# Patient Record
Sex: Female | Born: 1975
Health system: Southern US, Community
[De-identification: ages and names within clinical notes are randomized; demographics above are authoritative.]

## PROBLEM LIST (undated history)

## (undated) DIAGNOSIS — I10 Essential (primary) hypertension: Secondary | ICD-10-CM

## (undated) DIAGNOSIS — E559 Vitamin D deficiency, unspecified: Secondary | ICD-10-CM

## (undated) DIAGNOSIS — K589 Irritable bowel syndrome without diarrhea: Secondary | ICD-10-CM

## (undated) DIAGNOSIS — G4733 Obstructive sleep apnea (adult) (pediatric): Secondary | ICD-10-CM

## (undated) HISTORY — DX: Essential (primary) hypertension: I10

## (undated) HISTORY — DX: Vitamin D deficiency, unspecified: E55.9

## (undated) HISTORY — DX: Irritable bowel syndrome, unspecified: K58.9

## (undated) HISTORY — PX: TUBAL LIGATION: SHX77

## (undated) HISTORY — DX: Obstructive sleep apnea (adult) (pediatric): G47.33

---

## 2013-01-06 DIAGNOSIS — L709 Acne, unspecified: Secondary | ICD-10-CM | POA: Insufficient documentation

## 2017-01-08 DIAGNOSIS — R399 Unspecified symptoms and signs involving the genitourinary system: Secondary | ICD-10-CM | POA: Insufficient documentation

## 2018-03-30 DIAGNOSIS — I10 Essential (primary) hypertension: Secondary | ICD-10-CM | POA: Insufficient documentation

## 2018-03-30 DIAGNOSIS — F32A Depression, unspecified: Secondary | ICD-10-CM | POA: Insufficient documentation

## 2018-03-30 DIAGNOSIS — F419 Anxiety disorder, unspecified: Secondary | ICD-10-CM | POA: Insufficient documentation

## 2018-03-30 DIAGNOSIS — K589 Irritable bowel syndrome without diarrhea: Secondary | ICD-10-CM | POA: Insufficient documentation

## 2018-03-30 DIAGNOSIS — K581 Irritable bowel syndrome with constipation: Secondary | ICD-10-CM | POA: Insufficient documentation

## 2018-03-30 DIAGNOSIS — G4733 Obstructive sleep apnea (adult) (pediatric): Secondary | ICD-10-CM | POA: Insufficient documentation

## 2018-04-01 DIAGNOSIS — E669 Obesity, unspecified: Secondary | ICD-10-CM | POA: Insufficient documentation

## 2018-04-01 DIAGNOSIS — R600 Localized edema: Secondary | ICD-10-CM | POA: Insufficient documentation

## 2018-04-01 DIAGNOSIS — E559 Vitamin D deficiency, unspecified: Secondary | ICD-10-CM | POA: Insufficient documentation

## 2018-06-29 DIAGNOSIS — K219 Gastro-esophageal reflux disease without esophagitis: Secondary | ICD-10-CM | POA: Insufficient documentation

## 2019-07-18 ENCOUNTER — Other Ambulatory Visit: Payer: Self-pay

## 2019-07-18 DIAGNOSIS — Z20822 Contact with and (suspected) exposure to covid-19: Secondary | ICD-10-CM

## 2019-07-19 LAB — NOVEL CORONAVIRUS, NAA: SARS-CoV-2, NAA: NOT DETECTED

## 2020-01-09 ENCOUNTER — Encounter: Payer: Self-pay | Admitting: Family Medicine

## 2020-01-09 ENCOUNTER — Ambulatory Visit: Payer: Self-pay | Attending: Family Medicine | Admitting: Family Medicine

## 2020-01-09 ENCOUNTER — Other Ambulatory Visit: Payer: Self-pay

## 2020-01-09 DIAGNOSIS — L309 Dermatitis, unspecified: Secondary | ICD-10-CM

## 2020-01-09 DIAGNOSIS — Z9989 Dependence on other enabling machines and devices: Secondary | ICD-10-CM

## 2020-01-09 DIAGNOSIS — E559 Vitamin D deficiency, unspecified: Secondary | ICD-10-CM

## 2020-01-09 DIAGNOSIS — I10 Essential (primary) hypertension: Secondary | ICD-10-CM

## 2020-01-09 DIAGNOSIS — Z114 Encounter for screening for human immunodeficiency virus [HIV]: Secondary | ICD-10-CM

## 2020-01-09 DIAGNOSIS — K581 Irritable bowel syndrome with constipation: Secondary | ICD-10-CM

## 2020-01-09 DIAGNOSIS — G4733 Obstructive sleep apnea (adult) (pediatric): Secondary | ICD-10-CM

## 2020-01-09 DIAGNOSIS — Z79899 Other long term (current) drug therapy: Secondary | ICD-10-CM

## 2020-01-09 DIAGNOSIS — Z7689 Persons encountering health services in other specified circumstances: Secondary | ICD-10-CM

## 2020-01-09 MED ORDER — TRIAMCINOLONE ACETONIDE 0.1 % EX CREA: TOPICAL_CREAM | Freq: Two times a day (BID) | CUTANEOUS | 6 refills | Status: DC | PRN

## 2020-01-09 MED ORDER — HYDROCHLOROTHIAZIDE 25 MG PO TABS: 25.0000 mg | ORAL_TABLET | Freq: Every day | ORAL | 1 refills | Status: DC

## 2020-01-09 NOTE — Progress Notes (Signed)
New Patient, [facility] 

## 2020-01-09 NOTE — Progress Notes (Signed)
Virtual Visit via Telephone Note  I connected with Vanessa Burton on 01/09/20 at  1:50 PM EDT by telephone and verified that I am speaking with the correct person using two identifiers.   I discussed the limitations, risks, security and privacy concerns of performing an evaluation and management service by telephone and the availability of in person appointments. I also discussed with the patient that there may be a patient responsible charge related to this service. The patient expressed understanding and agreed to proceed.  Patient Location: Work Dispensing optician: [facility]  Others participating in call: none   History of Present Illness:        44 year old female who is new to the practice that she has [place].  She reports ongoing medical issues including hypertension for which she is currently on hydrochlorothiazide and blood pressure runs from 120-140/70-80.  She denies headaches or dizziness related to her blood pressure. She also has obstructive sleep apnea for which she uses CPAP.  She reports that she is compliant with nightly use of CPAP.  She believes that her last sleep study was more than 2 years ago.  She would like to have referral to be seen by a sleep medicine specialist.  She also has a history of irritable bowel syndrome of constipation and she states that since moving here, she has had worsening constipation over the past few weeks.  She is not currently on any medication for her irritable bowel syndrome with constipation but started going more than 3 days without a bowel movement therefore she started taking over-the-counter milk of magnesia.  She denies any blood in the stool and no black stools.  She denies any current issues with abdominal pain.  She is not interested in starting medication such as MiraLAX as she believes that she may have taken this in the past and it was not helpful.  She would not like to try any medications prior to being referred to  gastroenterology as she states that she wishes to start new with a new gastroenterologist here in the [place] area.  She would like to come into the office to have blood work done including lipid panel, recheck of electrolytes and she states that she has yearly screening for HIV.  She also has had prior vitamin D deficiency but is not currently on a vitamin D supplement.  She also requests referral to GYN for annual female/gynecologic well exam.  She is currently only on hydrochlorothiazide for treatment of hypertension which she states has been working well.  She is also use triamcinolone cream in the past for treatment of eczema and she would like to have new prescription to use as needed.  Overall she feels that she is doing well at this time other than having the recent increase in constipation.  She has had no fever or chills, no shortness of breath or cough, no peripheral edema and no chest pain or palpitations.  No urinary frequency or dysuria.  Past Medical History:  Diagnosis Date  . Hypertension   . Irritable bowel syndrome (IBS)   . Vitamin D deficiency     Past Surgical History:  Procedure Laterality Date  . TUBAL LIGATION      Family History  Problem Relation Age of Onset  . Hypertension Mother   . Kidney disease Father   . Diabetes Father     Social History   Tobacco Use  . Smoking status: Never Smoker  . Smokeless tobacco: Never Used  Substance Use Topics  . Alcohol use: Never  . Drug use: Never     No Known Allergies     Observations/Objective: No vital signs or physical exam conducted as visit was done via telephone  Assessment and Plan: 1. Irritable bowel syndrome with constipation She reports that since she moved to the area she has had increased issues with constipation.  She has tried increasing fiber and water intake.  She also has had to use over-the-counter laxatives.  She is not currently on any medicine for IBS with constipation and would like to  establish with a gastroenterologist here for a new start and reevaluation.  She will have comprehensive metabolic panel, CBC with differential and thyroid panel at an upcoming lab visit in follow-up of an for further evaluation of her irritable bowel syndrome, constipation predominant.  She declined prescription for MiraLAX while waiting for her GI appointment. - Comprehensive metabolic panel; Future - CBC with Differential; Future - Thyroid Panel With TSH; Future  2. OSA treated with CPAP She has been referred to pulmonology to see a sleep medicine specialist in follow-up of her obstructive sleep apnea treated with CPAP.  She believes that it has been more than 2 years since her last sleep study. - Ambulatory referral to Pulmonology  3. Essential hypertension She reports that her home blood pressure is controlled on the current medication, hydrochlorothiazide which is being refilled at today's visit.  She will have upcoming lab visit for comprehensive metabolic panel to check electrolytes and lipid panel in follow-up of being hypertensive. - hydrochlorothiazide (HYDRODIURIL) 25 MG tablet; Take 1 tablet (25 mg total) by mouth daily.  Dispense: 90 tablet; Refill: 1 - Comprehensive metabolic panel; Future - Lipid panel; Future  4. Eczema, unspecified type Refill of triamcinolone cream provided to use as needed for rash/eczema - triamcinolone cream (KENALOG) 0.1 %; Apply topically 2 (two) times daily as needed.  Dispense: 30 g; Refill: 6  5. Encounter for long-term current use of medication Comprehensive metabolic panel in follow-up of long-term use of medications as well as in follow-up of IBS and hypertension medication - Comprehensive metabolic panel; Future  6. Screening for HIV (human immunodeficiency virus) Patient will have screening for HIV with upcoming lab visit - HIV antibody (with reflex); Future  7. Vitamin D deficiency She has history of vitamin D deficiency and will have  vitamin D level done at upcoming lab visit and will be notified if over-the-counter versus prescription vitamin D therapy is warranted - Vitamin D, 25-hydroxy; Future  Follow Up Instructions:Return in about 3 months (around 04/10/2020) for Chronic issues; fasting labs in the next few weeks.    I discussed the assessment and treatment plan with the patient. The patient was provided an opportunity to ask questions and all were answered. The patient agreed with the plan and demonstrated an understanding of the instructions.   The patient was advised to call back or seek an in-person evaluation if the symptoms worsen or if the condition fails to improve as anticipated.  I provided 18 minutes of non-face-to-face time during this encounter.   Antony Blackbird, MD

## 2020-01-26 ENCOUNTER — Ambulatory Visit: Payer: Self-pay | Admitting: Family Medicine

## 2020-01-26 ENCOUNTER — Telehealth: Payer: Self-pay | Admitting: Family Medicine

## 2020-01-26 NOTE — Telephone Encounter (Signed)
Pt called to speak with the office manager since she received a facility bill, that she was not inform about it and she does not want to talk to the billing department, she prefer to speak with the office manager, please follow up

## 2020-02-20 ENCOUNTER — Encounter: Payer: Medicaid Other | Admitting: Obstetrics & Gynecology

## 2020-10-22 ENCOUNTER — Emergency Department (HOSPITAL_COMMUNITY): Payer: Medicaid - Out of State

## 2020-10-22 ENCOUNTER — Emergency Department (HOSPITAL_COMMUNITY)
Admission: EM | Admit: 2020-10-22 | Discharge: 2020-10-23 | Disposition: A | Discharge status: Home / Self Care | Payer: Medicaid - Out of State | Attending: Emergency Medicine | Admitting: Emergency Medicine

## 2020-10-22 ENCOUNTER — Other Ambulatory Visit: Payer: Self-pay

## 2020-10-22 DIAGNOSIS — R519 Headache, unspecified: Secondary | ICD-10-CM | POA: Diagnosis present

## 2020-10-22 DIAGNOSIS — Z20822 Contact with and (suspected) exposure to covid-19: Secondary | ICD-10-CM | POA: Insufficient documentation

## 2020-10-22 DIAGNOSIS — I10 Essential (primary) hypertension: Secondary | ICD-10-CM | POA: Diagnosis not present

## 2020-10-22 LAB — RESP PANEL BY RT-PCR (FLU A&B, COVID) ARPGX2
Influenza A by PCR: NEGATIVE
Influenza B by PCR: NEGATIVE
SARS Coronavirus 2 by RT PCR: NEGATIVE

## 2020-10-22 NOTE — ED Triage Notes (Signed)
Patient reported she's having some headache and some Uri symptoms. Concern for exposure to covid. Stated she is vaccinated.

## 2020-10-23 LAB — CBC WITH DIFFERENTIAL/PLATELET
Abs Immature Granulocytes: 0.01 10*3/uL (ref 0.00–0.07)
Basophils Absolute: 0 10*3/uL (ref 0.0–0.1)
Basophils Relative: 1 %
Eosinophils Absolute: 0.2 10*3/uL (ref 0.0–0.5)
Eosinophils Relative: 3 %
HCT: 37 % (ref 36.0–46.0)
Hemoglobin: 11.6 g/dL — ABNORMAL LOW (ref 12.0–15.0)
Immature Granulocytes: 0 %
Lymphocytes Relative: 40 %
Lymphs Abs: 2 10*3/uL (ref 0.7–4.0)
MCH: 29.2 pg (ref 26.0–34.0)
MCHC: 31.4 g/dL (ref 30.0–36.0)
MCV: 93.2 fL (ref 80.0–100.0)
Monocytes Absolute: 0.5 10*3/uL (ref 0.1–1.0)
Monocytes Relative: 9 %
Neutro Abs: 2.3 10*3/uL (ref 1.7–7.7)
Neutrophils Relative %: 47 %
Platelets: 309 10*3/uL (ref 150–400)
RBC: 3.97 MIL/uL (ref 3.87–5.11)
RDW: 14.4 % (ref 11.5–15.5)
WBC: 4.9 10*3/uL (ref 4.0–10.5)
nRBC: 0 % (ref 0.0–0.2)

## 2020-10-23 LAB — BASIC METABOLIC PANEL
Anion gap: 8 (ref 5–15)
BUN: 11 mg/dL (ref 6–20)
CO2: 25 mmol/L (ref 22–32)
Calcium: 9.2 mg/dL (ref 8.9–10.3)
Chloride: 104 mmol/L (ref 98–111)
Creatinine, Ser: 0.79 mg/dL (ref 0.44–1.00)
GFR, Estimated: >60 mL/min (ref >60)
Glucose, Bld: 94 mg/dL (ref 70–99)
Potassium: 3.9 mmol/L (ref 3.5–5.1)
Sodium: 137 mmol/L (ref 135–145)

## 2020-10-23 LAB — I-STAT BETA HCG BLOOD, ED (MC, WL, AP ONLY): I-stat hCG, quantitative: <5 m[IU]/mL (ref <5)

## 2020-10-23 LAB — TSH: TSH: 2.653 u[IU]/mL (ref 0.350–4.500)

## 2020-10-23 MED ORDER — HYDROCHLOROTHIAZIDE 25 MG PO TABS
25.0000 mg | ORAL_TABLET | Freq: Every day | ORAL | Status: DC
  Administered 2020-10-23: 25 mg via ORAL
  Filled 2020-10-23: qty 1

## 2020-10-23 MED ORDER — KETOROLAC TROMETHAMINE 15 MG/ML IJ SOLN
30.0000 mg | Freq: Once | INTRAMUSCULAR | Status: AC
  Administered 2020-10-23: 30 mg via INTRAMUSCULAR
  Filled 2020-10-23: qty 2

## 2020-10-23 MED ORDER — HYDROCHLOROTHIAZIDE 25 MG PO TABS: 25.0000 mg | ORAL_TABLET | Freq: Every day | ORAL | 0 refills | Status: DC

## 2020-10-23 NOTE — ED Notes (Signed)
Pt getting very upset with staff and security. Pt upset that we can't give her a definite time for how much longer she will have to wait. The waiting process was explained to pt, however not understood. Pt continues to disrupt lobby and argue with staff.

## 2020-10-23 NOTE — ED Provider Notes (Signed)
[facility] EMERGENCY DEPARTMENT Provider Note   [name]: [id number] Arrival date & time: 10/22/20  1835     History Chief Complaint  Patient presents with  . Headache   Jeff Mccallum is a 45 y.o. female.  Cora Daniels reports that she has been experiencing intermittent frontal headaches for the last 2 weeks.  Headache pain does not radiate.  Patient noted to have history of hypertension and has been out of her hydrochlorothiazide for about 6 months.  She states they her headache on yesterday morning lasted longer than normal.  She describes her headache as throbbing in nature and associated with nausea but no emesis.  Patient also reports some generalized weakness with dizziness but has not had any episodes of syncope or unsteady gait.  She reports living with her 2 sons who have 9 notify her of any slurring her speech or facial droop.  Patient does report some blurry vision and "seeing spots".  She tried Tylenol with no relief of her symptoms.  She states that her headache was 10/10 in severity prior to presenting to the ED and now headache has slightly improved to 7/10 in severity.  She denies dropping objects due to weakness in her upper extremities. Patient also reports heaviness just inferior to her clavicular area bilaterally.  She denies any coughing sneezing or fevers but does report some intermittent cold chills.  Patient denies any shortness of breath.  She reports bilateral lower extremity swelling that occurs intermittently.  She denies any unilateral swelling greater than contralateral extremity.  She reports that her mother passed away from a myocardial infarction.  Patient denies any recent prolonged travel or sitting.  She states that she was around family members who recently tested positive for Covid.        Past Medical History:  Diagnosis Date  . Hypertension   . Irritable bowel syndrome (IBS)   . Vitamin D deficiency     Patient Active Problem  List   Diagnosis Date Noted  . GERD (gastroesophageal reflux disease) 06/29/2018  . Bilateral leg edema 04/01/2018  . Obesity 04/01/2018  . Vitamin D deficiency 04/01/2018  . Anxiety 03/30/2018  . Depression 03/30/2018  . Essential hypertension 03/30/2018  . Irritable bowel syndrome with constipation 03/30/2018  . OSA (obstructive sleep apnea) 03/30/2018  . Acne 01/06/2013    Past Surgical History:  Procedure Laterality Date  . TUBAL LIGATION       OB History   No obstetric history on file.     Family History  Problem Relation Age of Onset  . Hypertension Mother   . Kidney disease Father   . Diabetes Father     Social History   Tobacco Use  . Smoking status: Never Smoker  . Smokeless tobacco: Never Used  Vaping Use  . Vaping Use: Never used  Substance Use Topics  . Alcohol use: Never  . Drug use: Never    Home Medications Prior to Admission medications   Medication Sig Start Date End Date Taking? Authorizing Provider  hydrochlorothiazide (HYDRODIURIL) 25 MG tablet Take 1 tablet (25 mg total) by mouth daily. 10/23/20   Simmons-Robinson, Margeart Allender, MD  triamcinolone cream (KENALOG) 0.1 % Apply topically 2 (two) times daily as needed. 01/09/20   Cain Saupe, MD    Allergies    Patient has no known allergies.  Review of Systems   Review of Systems  Constitutional: Positive for appetite change, chills and fatigue. Negative for fever.  HENT: Negative for hearing loss  and sneezing.   Respiratory: Negative for cough and shortness of breath.   Cardiovascular: Positive for chest pain and leg swelling.  Gastrointestinal: Positive for nausea. Negative for vomiting.  Genitourinary: Negative for difficulty urinating and hematuria.  Musculoskeletal: Negative for gait problem and neck pain.  Neurological: Positive for dizziness, weakness, light-headedness and headaches. Negative for syncope, facial asymmetry, speech difficulty and numbness.  Psychiatric/Behavioral: Negative  for confusion.    Physical Exam Updated Vital Signs BP (!) 160/90 (BP Location: Right Arm)   Pulse 65   Temp 98 F (36.7 C) (Oral)   Resp 16   Ht 5\' 7"  (1.702 m)   Wt 95.3 kg   SpO2 98%   BMI 32.89 kg/m   Physical Exam Vitals reviewed.  Constitutional:      General: She is not in acute distress.    Appearance: She is well-developed. She is not toxic-appearing.  HENT:     Head: Normocephalic and atraumatic.     Mouth/Throat:     Mouth: Mucous membranes are moist.     Pharynx: Oropharynx is clear.  Eyes:     General: No scleral icterus.    Extraocular Movements: Extraocular movements intact.     Right eye: No nystagmus.     Left eye: No nystagmus.     Pupils: Pupils are equal, round, and reactive to light. Pupils are equal.  Cardiovascular:     Rate and Rhythm: Normal rate and regular rhythm.     Heart sounds: Normal heart sounds. No murmur heard. No friction rub. No gallop.   Pulmonary:     Effort: Pulmonary effort is normal. No respiratory distress.     Breath sounds: Normal breath sounds. No wheezing or rales.  Chest:     Chest wall: No tenderness.  Abdominal:     General: Bowel sounds are normal.     Palpations: Abdomen is soft.     Tenderness: There is no abdominal tenderness.  Musculoskeletal:     Cervical back: Normal range of motion.  Lymphadenopathy:     Cervical: No cervical adenopathy.  Skin:    Coloration: Skin is not pale.  Neurological:     Mental Status: She is alert and oriented to person, place, and time.     GCS: GCS eye subscore is 4. GCS verbal subscore is 5. GCS motor subscore is 6.     Cranial Nerves: No cranial nerve deficit, dysarthria or facial asymmetry.     Sensory: No sensory deficit.     Motor: No weakness.    ED Results / Procedures / Treatments   Labs (all labs ordered are listed, but only abnormal results are displayed) Labs Reviewed  CBC WITH DIFFERENTIAL/PLATELET - Abnormal; Notable for the following components:       Result Value   Hemoglobin 11.6 (*)    All other components within normal limits  RESP PANEL BY RT-PCR (FLU A&B, COVID) ARPGX2  BASIC METABOLIC PANEL  TSH  I-STAT BETA HCG BLOOD, ED ([facility], [facility], [facility] ONLY)    EKG EKG Interpretation  Date/Time:  Tuesday October 23 2020 09:44:32 EST Ventricular Rate:  59 PR Interval:  158 QRS Duration: 78 QT Interval:  390 QTC Calculation: 386 R Axis:   59 Text Interpretation: Sinus bradycardia with sinus arrhythmia Otherwise normal ECG No old tracing to compare Confirmed by [id number] [phone]) on 10/23/2020 10:45:32 AM   Radiology DG Chest 2 View  Result Date: 10/22/2020 CLINICAL DATA:  Headache, cough and nausea x1 day. EXAM: CHEST -  2 VIEW COMPARISON:  None. FINDINGS: Mildly decreased lung volumes are seen which is likely secondary to the degree of patient inspiration. There is no evidence of acute infiltrate, pleural effusion or pneumothorax. The heart size and mediastinal contours are within normal limits. The visualized skeletal structures are unremarkable. IMPRESSION: No active cardiopulmonary disease. Electronically Signed   By: Virgina Norfolk M.D.   On: 10/22/2020 19:23    Procedures Procedures (including critical care time)  Medications Ordered in ED Medications  hydrochlorothiazide (HYDRODIURIL) tablet 25 mg (25 mg Oral Given 10/23/20 0949)  ketorolac (TORADOL) 15 MG/ML injection 30 mg (30 mg Intramuscular Given 10/23/20 7341)    ED Course  I have reviewed the triage vital signs and the nursing notes.  Pertinent labs & imaging results that were available during my care of the patient were reviewed by me and considered in my medical decision making (see chart for details).  DG Chest 2 View  Result Date: 10/22/2020 CLINICAL DATA:  Headache, cough and nausea x1 day. EXAM: CHEST - 2 VIEW COMPARISON:  None. FINDINGS: Mildly decreased lung volumes are seen which is likely secondary to the degree of patient inspiration. There is no evidence of  acute infiltrate, pleural effusion or pneumothorax. The heart size and mediastinal contours are within normal limits. The visualized skeletal structures are unremarkable. IMPRESSION: No active cardiopulmonary disease. Electronically Signed   By: Virgina Norfolk M.D.   On: 10/22/2020 19:23   Clinical Course as of 10/23/20 1103  Tue Oct 23, 2020  0940 Patient underwent CXR with no active cardiopulmonary disease. BMP, TSH and CBC were collected. Patient was negative for COVID and influenza. She was treated with Toradol IM for HA and restarted on HCTZ for blood pressure management. EKG NSR, HR 59. No signs of ischemia [MS]    Clinical Course User Index [MS] Simmons-Robinson, Riki Sheer, MD   MDM Rules/Calculators/A&P                          Patient is a 45 year old female with past medical history significant for hypertension. The patient has been out of her antihypertensive medication for about 6 months, it is believed that her headache is secondary to elevated blood pressure measurements.  Patient's neurological exam was unremarkable with 5/5 strength, normal extraocular movements, grossly intact bilateral cranial nerves, no abnormalities in sensation and patient was alert and oriented throughout serial exams while here in the ED. Given normal neurological exam and no red flag in history,patient does not appear to need head imaging. HA was improved slightly from 10 to 7 during wait in the ED. Patient was found to be negative for COVID-19 and influenza.  Patient had improved blood pressure after dose of hydrochlorothiazide. Metabolic panel was completed to assess her renal function and electrolyte levels to assess for any potential etiology of her lower extremity edema.  Patient reported frequent meals prepared outside the home and discussed concern for elevated sodium intake that could be contributing to her lower extremity edema. Lab values were within normal limits. Patient was counseled to follow up on  TSH results once available via MyChart. Patient is not noted to have history of heart failure and given her lack of blood pressure control, think that this is secondary to increase sodium.  Bilateral extent of edema negative low suspicion for DVT.  Exam did not show any lower extremity edema or erythema.  Patient also had no recent prolonged travel/prolonged sitting to increase suspicion of lower extremity  DVT. Given normal lab results and no abnormal CXR nor EKG, patient was determined to be safe for discharge home with 1 month supply of home HCTZ 25mg  with instructions to schedule blood pressure follow up with Dr. at [facility].   Final Clinical Impression(s) / ED Diagnoses Final diagnoses:  Essential hypertension    Rx / DC Orders ED Discharge Orders         Ordered    hydrochlorothiazide (HYDRODIURIL) 25 MG tablet  Daily        10/23/20 1047           Simmons-Robinson, 12/21/20, MD 10/23/20 1103    12/21/20, MD 10/23/20 1250

## 2020-10-23 NOTE — Discharge Instructions (Addendum)
You were evaluated in the ED for headache as well as symptoms of respiratory infection.  Your Covid 19 and influenza testing both resulted as negative.  We believe that your headache is likely due to elevated blood pressure measurements.  We will prescribe your prior blood pressure medication of hydrochlorothiazide to be taken daily.  It is important that you follow-up with your primary care physician as outpatient in order to make sure you have refills and that your blood pressure is managed and to check your electrolyte levels on a regular basis. We also completed blood work to look at your electrolytes and kidney (which were within normal range) function as well as blood counts and your thyroid studies. Thyroid studies will take a while to result, please watch for these results via MyChart.  These tests were completed to further evaluate potential reasons for your leg swelling.  Please call [phone] to schedule a follow up appointment for your blood pressure and to assess leg swelling and any further blood work.   You will need to seek care if you begin to experience worsening weakness, slurred speech, facial drooping or blood pressures elevated above 170/100 or worsening headache.

## 2020-10-23 NOTE — ED Notes (Signed)
Pt approached nurses station to ask if she can be discharged due to prolonged wait. Educated patient blood work and EKG still needs to be obtained and provided patient with approx wait time for blood results once blood is obtained. Pt continues to ask if RN narrating can expedite the process and speak with with the doctor. Pt also requesting a different bed assignment due to not wanting to be in the hallway. Educated patient on full ER.

## 2020-10-23 NOTE — ED Notes (Signed)
Upon obtaining vital signs on patient, patient stated they were taking her BP on her left arm over her jacket. Upon assessment of jacket, jacket was noticed to be thick and would potentially cause an incorrect BP. Educated patient to roll her jacket sleeve up so RN could obtain a correct BP before administering BP medication. Pt became loud and stated "They have been doing it over my jacket this entire time. I do not need to roll my sleeve up." Re educated patient again on BP medication and need of a correct BP. Pt then stated "I want another nurse, your bedside manner is inapporiate. Upon walking away form patient patient rolled her jacket sleeve up and told RN to take her BP. RN obtained current vital signs and medicated patient per [facility].

## 2020-12-19 ENCOUNTER — Ambulatory Visit (INDEPENDENT_AMBULATORY_CARE_PROVIDER_SITE_OTHER): Payer: Medicaid - Out of State | Admitting: Family

## 2020-12-19 ENCOUNTER — Encounter: Payer: Self-pay | Admitting: Family

## 2020-12-19 ENCOUNTER — Other Ambulatory Visit: Payer: Self-pay

## 2020-12-19 VITALS — BP 150/98 | HR 95 | Ht 66.81 in | Wt 219.0 lb

## 2020-12-19 DIAGNOSIS — I10 Essential (primary) hypertension: Secondary | ICD-10-CM | POA: Diagnosis not present

## 2020-12-19 DIAGNOSIS — Z7689 Persons encountering health services in other specified circumstances: Secondary | ICD-10-CM | POA: Diagnosis not present

## 2020-12-19 NOTE — Progress Notes (Unsigned)
Re-establish care former [facility] pt BP concerns due to headaches Wants other options for BP meds

## 2020-12-19 NOTE — Patient Instructions (Addendum)
- Return for annual physical examination, labs, and health maintenance. Arrive fasting meaning having had no food and/or nothing to drink for at least 8 hours prior to appointment. - Increasing Hydrochlorothiazide for high blood pressure.  - Adding Amlodipine for high blood pressure.  - Follow-up in 2 weeks for blood pressure check and annual physical exam. - Thank you for choosing Primary Care at [facility] for your medical home!    Vanessa Burton was seen by Rema Fendt, NP today.   Renae Fickle Hefty's primary care provider is Rema Fendt, NP.   For the best care possible,  you should try to see Ricky Stabs, NP whenever you come to clinic.   We look forward to seeing you again soon!  If you have any questions about your visit today,  please call us at [phone]  Or feel free to reach your provider via MyChart.    Hypertension, Adult Hypertension is another name for high blood pressure. High blood pressure forces your heart to work harder to pump blood. This can cause problems over time. There are two numbers in a blood pressure reading. There is a top number (systolic) over a bottom number (diastolic). It is best to have a blood pressure that is below 120/80. Healthy choices can help lower your blood pressure, or you may need medicine to help lower it. What are the causes? The cause of this condition is not known. Some conditions may be related to high blood pressure. What increases the risk?  Smoking.  Having type 2 diabetes mellitus, high cholesterol, or both.  Not getting enough exercise or physical activity.  Being overweight.  Having too much fat, sugar, calories, or salt (sodium) in your diet.  Drinking too much alcohol.  Having long-term (chronic) kidney disease.  Having a family history of high blood pressure.  Age. Risk increases with age.  Race. You may be at higher risk if you are African American.  Gender. Men are at higher risk than women  before age 56. After age 10, women are at higher risk than men.  Having obstructive sleep apnea.  Stress. What are the signs or symptoms?  High blood pressure may not cause symptoms. Very high blood pressure (hypertensive crisis) may cause: ? Headache. ? Feelings of worry or nervousness (anxiety). ? Shortness of breath. ? Nosebleed. ? A feeling of being sick to your stomach (nausea). ? Throwing up (vomiting). ? Changes in how you see. ? Very bad chest pain. ? Seizures. How is this treated?  This condition is treated by making healthy lifestyle changes, such as: ? Eating healthy foods. ? Exercising more. ? Drinking less alcohol.  Your health care provider may prescribe medicine if lifestyle changes are not enough to get your blood pressure under control, and if: ? Your top number is above 130. ? Your bottom number is above 80.  Your personal target blood pressure may vary. Follow these instructions at home: Eating and drinking  If told, follow the DASH eating plan. To follow this plan: ? Fill one half of your plate at each meal with fruits and vegetables. ? Fill one fourth of your plate at each meal with whole grains. Whole grains include whole-wheat pasta, brown rice, and whole-grain bread. ? Eat or drink low-fat dairy products, such as skim milk or low-fat yogurt. ? Fill one fourth of your plate at each meal with low-fat (lean) proteins. Low-fat proteins include fish, chicken without skin, eggs, beans, and tofu. ? Avoid fatty meat,  cured and processed meat, or chicken with skin. ? Avoid pre-made or processed food.  Eat less than 1,500 mg of salt each day.  Do not drink alcohol if: ? Your doctor tells you not to drink. ? You are pregnant, may be pregnant, or are planning to become pregnant.  If you drink alcohol: ? Limit how much you use to:  0-1 drink a day for women.  0-2 drinks a day for men. ? Be aware of how much alcohol is in your drink. In the [place] one  drink equals one 12 oz bottle of beer (355 mL), one 5 oz glass of wine (148 mL), or one 1 oz glass of hard liquor (44 mL).   Lifestyle  Work with your doctor to stay at a healthy weight or to lose weight. Ask your doctor what the best weight is for you.  Get at least 30 minutes of exercise most days of the week. This may include walking, swimming, or biking.  Get at least 30 minutes of exercise that strengthens your muscles (resistance exercise) at least 3 days a week. This may include lifting weights or doing Pilates.  Do not use any products that contain nicotine or tobacco, such as cigarettes, e-cigarettes, and chewing tobacco. If you need help quitting, ask your doctor.  Check your blood pressure at home as told by your doctor.  Keep all follow-up visits as told by your doctor. This is important.   Medicines  Take over-the-counter and prescription medicines only as told by your doctor. Follow directions carefully.  Do not skip doses of blood pressure medicine. The medicine does not work as well if you skip doses. Skipping doses also puts you at risk for problems.  Ask your doctor about side effects or reactions to medicines that you should watch for. Contact a doctor if you:  Think you are having a reaction to the medicine you are taking.  Have headaches that keep coming back (recurring).  Feel dizzy.  Have swelling in your ankles.  Have trouble with your vision. Get help right away if you:  Get a very bad headache.  Start to feel mixed up (confused).  Feel weak or numb.  Feel faint.  Have very bad pain in your: ? Chest. ? Belly (abdomen).  Throw up more than once.  Have trouble breathing. Summary  Hypertension is another name for high blood pressure.  High blood pressure forces your heart to work harder to pump blood.  For most people, a normal blood pressure is less than 120/80.  Making healthy choices can help lower blood pressure. If your blood  pressure does not get lower with healthy choices, you may need to take medicine. This information is not intended to replace advice given to you by your health care provider. Make sure you discuss any questions you have with your health care provider. Document Revised: 06/16/2018 Document Reviewed: 06/16/2018 Elsevier Patient Education  2021 [employer].

## 2020-12-19 NOTE — Progress Notes (Signed)
Subjective:    Vanessa Burton - 45 y.o. female MRN [id number]  Date of birth: 1976-02-26  HPI  Vanessa Burton is to establish care. Patient has a PMH significant for essential hypertension, obstructive sleep apnea, gastroesophageal reflux disease, irritable bowel syndrome with constipation, acne, anxiety, bilateral leg edema, depression, obesity, and vitamin D deficiency.   Current issues and/or concerns: 1. HYPERTENSION FOLLOW-UP: Visit 10/22/2020 at the [facility] Emergency Department per MD note: Patient is a 45 year old female with past medical history significant for hypertension. The patient has been out of her antihypertensive medication for about 6 months, it is believed that her headache is secondary to elevated blood pressure measurements.  Patient's neurological exam was unremarkable with 5/5 strength, normal extraocular movements, grossly intact bilateral cranial nerves, no abnormalities in sensation and patient was alert and oriented throughout serial exams while here in the ED. Given normal neurological exam and no red flag in history,patient does not appear to need head imaging. HA was improved slightly from 10 to 7 during wait in the ED. Patient was found to be negative for COVID-19 and influenza.  Patient had improved blood pressure after dose of hydrochlorothiazide. Metabolic panel was completed to assess her renal function and electrolyte levels to assess for any potential etiology of her lower extremity edema.  Patient reported frequent meals prepared outside the home and discussed concern for elevated sodium intake that could be contributing to her lower extremity edema. Lab values were within normal limits. Patient was counseled to follow up on TSH results once available via MyChart. Patient is not noted to have history of heart failure and given her lack of blood pressure control, think that this is secondary to increase sodium.  Bilateral extent of edema negative low  suspicion for DVT.  Exam did not show any lower extremity edema or erythema. Patient also had no recent prolonged travel/prolonged sitting to increase suspicion of lower extremity DVT. Given normal lab results and no abnormal CXR nor EKG, patient was determined to be safe for discharge home with 1 month supply of home HCTZ 25mg  with instructions to schedule blood pressure follow up with Dr. at [facility].    12/19/2020: Currently taking: see medication list Have you taken your blood pressure medication today: []  Yes [x]  No  Med Adherence: []  Yes    [x]  No, taking medication every other day so that medication would stretch Medication side effects: []  Yes    [x]  No Adherence with salt restriction (low-salt diet): []  Yes    [x]  No Exercise: Yes []  No [x]  Home Monitoring?: [x]  Yes, 140's/90's Smoking []  Yes [x]  No SOB? []  Yes    [x]  No Chest Pain?: [x]  Yes, sometimes  Leg swelling?: [x]  Yes, comes and goes and recently having increased sodium intake  Headaches?: [x]  Yes    []  No Dizziness? []  Yes    [x]  No  ROS per HPI   Health Maintenance:  Health Maintenance Due  Topic Date Due  . Hepatitis C Screening  Never done  . COVID-19 Vaccine (1) Never done  . TETANUS/TDAP  Never done  . PAP SMEAR-Modifier  Never done  . INFLUENZA VACCINE  Never done    Past Medical History: Patient Active Problem List   Diagnosis Date Noted  . GERD (gastroesophageal reflux disease) 06/29/2018  . Bilateral leg edema 04/01/2018  . Obesity 04/01/2018  . Vitamin D deficiency 04/01/2018  . Anxiety 03/30/2018  . Depression 03/30/2018  . Essential hypertension 03/30/2018  .  Irritable bowel syndrome with constipation 03/30/2018  . OSA (obstructive sleep apnea) 03/30/2018  . Acne 01/06/2013    Social History   reports that she has never smoked. She has never used smokeless tobacco. She reports that she does not drink alcohol and does not use drugs.   Family History  family  history includes Diabetes in her father; Hypertension in her mother; Kidney disease in her father.   Medications: reviewed and updated   Objective:   Physical Exam BP (!) 150/98 (BP Location: Left Arm, Patient Position: Sitting)   Pulse 95   Ht 5' 6.81" (1.697 m)   Wt 219 lb (99.3 kg)   SpO2 96%   BMI 34.49 kg/m    Physical Exam Constitutional:      Appearance: She is obese.  HENT:     Head: Normocephalic and atraumatic.  Eyes:     Extraocular Movements: Extraocular movements intact.     Pupils: Pupils are equal, round, and reactive to light.  Cardiovascular:     Rate and Rhythm: Normal rate and regular rhythm.     Pulses: Normal pulses.     Heart sounds: Normal heart sounds.  Pulmonary:     Effort: Pulmonary effort is normal.     Breath sounds: Normal breath sounds.  Musculoskeletal:     Cervical back: Normal range of motion and neck supple.  Neurological:     General: No focal deficit present.     Mental Status: She is alert and oriented to person, place, and time.  Psychiatric:        Mood and Affect: Mood normal.        Behavior: Behavior normal.     Assessment & Plan:  1. Encounter to establish care: - Patient presents today to establish care.  - Return for annual physical examination, labs, and health maintenance. Arrive fasting meaning having had no food and/or nothing to drink for at least 8 hours prior to appointment.  Please take scheduled medications as normal.  2. Essential hypertension: - Blood pressure not at goal during today's visit. Patient asymptomatic without chest pressure, chest pain, palpitations, shortness of breath, and worst headache of life. - Today patient states taking Hydrochlorothiazide every other day in attempts to stretch medication.  - Increase Hydrochlorothiazide from 25 mg daily to 50 mg daily.  - Adding low-dose Amlodipine for hypertension.  - Follow-up within 2 weeks for blood pressure check. Write down your blood pressure  readings each day and bring those results along with your home blood pressure monitor to your appointment.  - Counseled on blood pressure goal of less than 130/80, low-sodium, DASH diet, medication compliance, 150 minutes of moderate intensity exercise per week as tolerated. Discussed medication compliance, adverse effects. - hydrochlorothiazide (HYDRODIURIL) 50 MG tablet; Take 1 tablet (50 mg total) by mouth daily.  Dispense: 30 tablet; Refill: 0 - amLODipine (NORVASC) 5 MG tablet; Take 1 tablet (5 mg total) by mouth daily.  Dispense: 30 tablet; Refill: 0   Patient was given clear instructions to go to Emergency Department or return to medical center if symptoms don't improve, worsen, or new problems develop.The patient verbalized understanding.  I discussed the assessment and treatment plan with the patient. The patient was provided an opportunity to ask questions and all were answered. The patient agreed with the plan and demonstrated an understanding of the instructions.   The patient was advised to call back or seek an in-person evaluation if the symptoms worsen or if the condition fails  to improve as anticipated.    Ricky Stabs, NP 12/21/2020, 8:49 AM Primary Care at [facility]

## 2020-12-20 ENCOUNTER — Telehealth: Payer: Self-pay | Admitting: Family

## 2020-12-20 ENCOUNTER — Encounter: Payer: Medicaid - Out of State | Admitting: Family Medicine

## 2020-12-20 ENCOUNTER — Telehealth (INDEPENDENT_AMBULATORY_CARE_PROVIDER_SITE_OTHER): Payer: Self-pay | Admitting: Family

## 2020-12-20 MED ORDER — HYDROCHLOROTHIAZIDE 50 MG PO TABS
50.0000 mg | ORAL_TABLET | Freq: Every day | ORAL | 0 refills | Status: DC
Start: 2020-12-20 — End: 2021-01-04

## 2020-12-20 MED ORDER — AMLODIPINE BESYLATE 5 MG PO TABS
5.0000 mg | ORAL_TABLET | Freq: Every day | ORAL | 0 refills | Status: DC
Start: 2020-12-20 — End: 2021-01-04

## 2020-12-20 NOTE — Telephone Encounter (Signed)
I received a complaint from the patient saying she was seen yesterday and medications were not sent to her pharmacy. Patient states that her blood pressure is high and per the conversation she had with her provider yesterday she is to start taking the medication until she returns in two weeks. Patient is very up set and I apologized and explained to her that her provider was not in the office today but would reach out to the nurse and see if the medication could be called in. Patient still wants to make a complaint and I informed her I am the Engineer, manufacturing and she requested to speak with someone higher because she thinks is not fair that her medications were not sent in know that her blood pressure is high. Please f/u

## 2020-12-20 NOTE — Telephone Encounter (Signed)
1) Pt called requesting medication refills but refused to give Medication(s) name.    These are the medications on file that was mentioned to the pt but patient was unsure.  hydrochlorothiazide (HYDRODIURIL) 25 MG tablet triamcinolone cream (KENALOG) 0.1 %  Patient needs clarification on what was increased vs what was supposed to be refilled.   Avs states- Increasing Hydrochlorothiazide for high blood pressure.  - Adding Amlodipine for high blood pressure. Pt is asking why it wasn't sent to the pharmacy.   Pharmacy:  [address] [place], [place], [place] [address] 3) Special Requests:   Approved medications will be sent to the pharmacy, we will reach out if there is an issue.  Requests made after 3pm may not be addressed until the following business day!  If a patient is unsure of the name of the medication(s) please note and ask patient to call back when they are able to provide all info, do not send to responsible party until all information is available!

## 2020-12-25 ENCOUNTER — Encounter: Payer: Self-pay | Admitting: Family

## 2020-12-26 NOTE — Addendum Note (Signed)
Addended by: Rema Fendt on: 12/26/2020 09:13 AM   Modules accepted: Orders

## 2021-01-04 ENCOUNTER — Other Ambulatory Visit: Payer: Self-pay

## 2021-01-04 ENCOUNTER — Ambulatory Visit (INDEPENDENT_AMBULATORY_CARE_PROVIDER_SITE_OTHER): Payer: Self-pay | Admitting: Family

## 2021-01-04 VITALS — BP 129/80 | HR 87 | Ht 66.81 in | Wt 215.4 lb

## 2021-01-04 DIAGNOSIS — F419 Anxiety disorder, unspecified: Secondary | ICD-10-CM

## 2021-01-04 DIAGNOSIS — I10 Essential (primary) hypertension: Secondary | ICD-10-CM

## 2021-01-04 DIAGNOSIS — Z1329 Encounter for screening for other suspected endocrine disorder: Secondary | ICD-10-CM

## 2021-01-04 DIAGNOSIS — Z1159 Encounter for screening for other viral diseases: Secondary | ICD-10-CM

## 2021-01-04 DIAGNOSIS — Z131 Encounter for screening for diabetes mellitus: Secondary | ICD-10-CM

## 2021-01-04 DIAGNOSIS — Z0000 Encounter for general adult medical examination without abnormal findings: Secondary | ICD-10-CM

## 2021-01-04 DIAGNOSIS — Z114 Encounter for screening for human immunodeficiency virus [HIV]: Secondary | ICD-10-CM

## 2021-01-04 DIAGNOSIS — Z124 Encounter for screening for malignant neoplasm of cervix: Secondary | ICD-10-CM

## 2021-01-04 DIAGNOSIS — F32A Depression, unspecified: Secondary | ICD-10-CM

## 2021-01-04 DIAGNOSIS — Z13220 Encounter for screening for lipoid disorders: Secondary | ICD-10-CM

## 2021-01-04 DIAGNOSIS — Z130 Encounter for screening for diseases of the blood and blood-forming organs and certain disorders involving the immune mechanism: Secondary | ICD-10-CM

## 2021-01-04 DIAGNOSIS — Z1231 Encounter for screening mammogram for malignant neoplasm of breast: Secondary | ICD-10-CM

## 2021-01-04 DIAGNOSIS — Z13228 Encounter for screening for other metabolic disorders: Secondary | ICD-10-CM

## 2021-01-04 MED ORDER — AMLODIPINE BESYLATE 5 MG PO TABS: 5.0000 mg | ORAL_TABLET | Freq: Every day | ORAL | 0 refills | Status: DC

## 2021-01-04 MED ORDER — HYDROCHLOROTHIAZIDE 50 MG PO TABS: 50.0000 mg | ORAL_TABLET | Freq: Every day | ORAL | 0 refills | Status: DC

## 2021-01-04 MED ORDER — HYDROCHLOROTHIAZIDE 50 MG PO TABS
50.0000 mg | ORAL_TABLET | Freq: Every day | ORAL | 0 refills | Status: DC
Start: 2021-01-04 — End: 2021-01-04

## 2021-01-04 NOTE — Progress Notes (Signed)
Patient ID: Vanessa Burton, female    DOB: 1976-10-01  MRN: [id number]  CC: Annual Physical Exam  Subjective: Vanessa Burton is a 45 y.o. female who presents for annual physical exam.  Her concerns today include:    1. HYPERTENSION FOLLOW-UP: 12/19/2020: - Today patient states taking Hydrochlorothiazide every other day in attempts to stretch medication.  - Increase Hydrochlorothiazide from 25 mg daily to 50 mg daily.  - Adding low-dose Amlodipine for hypertension.   01/04/2021: Currently taking: see medication list Med Adherence: [x]  Yes    []  No Medication side effects: []  Yes    [x]  No Adherence with salt restriction (low-salt diet): She has improved diet since last visit. Eating more salads. Eating less snacks. Exercise: Yes, walking Home Monitoring?: [x]  Yes    []  No Monitoring Frequency: [x]  Yes    []  No Home BP results range: [x]  Yes, 130's/80's-90's Smoking []  Yes [x]  No SOB? []  Yes    [x]  No Chest Pain?: []  Yes    [x]  No Leg swelling?: []  Yes    [x]  No Dizziness? []  Yes    [x]  No Comments: Reports she does have appointment scheduled with Cardiology soon.  2. ANXIETY AND DEPRESSION: Reports primarily related to work-life balance. She has tried medication in the past and was not very helpful. Declines medication and counseling services at this time. She is planning on becoming more social soon with hopes this may help. Denies thoughts of self-harm, suicidal ideations, and homicidal ideations.  Depression screen [facility] 2/9 01/04/2021 12/19/2020 01/09/2020  Decreased Interest 2 0 0  Down, Depressed, Hopeless 2 0 0  PHQ - 2 Score 4 0 0  Altered sleeping 2 - 1  Tired, decreased energy 2 - 3  Change in appetite 3 - 0  Feeling bad or failure about yourself  0 - 0  Trouble concentrating 1 - 0  Moving slowly or fidgety/restless 0 - 0  Suicidal thoughts 0 - 0  PHQ-9 Score 12 - 4  Difficult doing work/chores Somewhat difficult - Not difficult at all    Patient Active  Problem List   Diagnosis Date Noted  . GERD (gastroesophageal reflux disease) 06/29/2018  . Bilateral leg edema 04/01/2018  . Obesity 04/01/2018  . Vitamin D deficiency 04/01/2018  . Anxiety 03/30/2018  . Depression 03/30/2018  . Essential hypertension 03/30/2018  . Irritable bowel syndrome with constipation 03/30/2018  . OSA (obstructive sleep apnea) 03/30/2018  . Acne 01/06/2013     Current Outpatient Medications on File Prior to Visit  Medication Sig Dispense Refill  . triamcinolone cream (KENALOG) 0.1 % Apply topically 2 (two) times daily as needed. 30 g 6   No current facility-administered medications on file prior to visit.    No Known Allergies  Social History   Socioeconomic History  . Marital status: Single    Spouse name: Not on file  . Number of children: Not on file  . Years of education: Not on file  . Highest education level: Not on file  Occupational History  . Not on file  Tobacco Use  . Smoking status: Never Smoker  . Smokeless tobacco: Never Used  Vaping Use  . Vaping Use: Never used  Substance and Sexual Activity  . Alcohol use: Never  . Drug use: Never  . Sexual activity: Not on file  Other Topics Concern  . Not on file  Social History Narrative  . Not on file   Social Determinants of Health   Financial Resource  Strain: Not on file  Food Insecurity: Not on file  Transportation Needs: Not on file  Physical Activity: Not on file  Stress: Not on file  Social Connections: Not on file  Intimate Partner Violence: Not on file    Family History  Problem Relation Age of Onset  . Hypertension Mother   . Kidney disease Father   . Diabetes Father     Past Surgical History:  Procedure Laterality Date  . TUBAL LIGATION      ROS: Review of Systems Negative except as stated above  PHYSICAL EXAM: BP 129/80 (BP Location: Left Arm, Patient Position: Sitting)   Pulse 87   Ht 5' 6.81" (1.697 m)   Wt 215 lb 6.4 oz (97.7 kg)   SpO2 97%    BMI 33.93 kg/m   Wt Readings from Last 3 Encounters:  01/04/21 215 lb 6.4 oz (97.7 kg)  12/19/20 219 lb (99.3 kg)  10/22/20 210 lb (95.3 kg)    Physical Exam Exam conducted with a chaperone present.  HENT:     Head: Normocephalic and atraumatic.     Right Ear: Tympanic membrane, ear canal and external ear normal.     Left Ear: Tympanic membrane, ear canal and external ear normal.     Nose: Nose normal.     Mouth/Throat:     Mouth: Mucous membranes are moist.     Pharynx: Oropharynx is clear.  Eyes:     Extraocular Movements: Extraocular movements intact.     Conjunctiva/sclera: Conjunctivae normal.     Pupils: Pupils are equal, round, and reactive to light.  Cardiovascular:     Rate and Rhythm: Normal rate and regular rhythm.     Pulses: Normal pulses.     Heart sounds: Normal heart sounds.  Pulmonary:     Effort: Pulmonary effort is normal.     Breath sounds: Normal breath sounds.  Chest:  Breasts:     Right: Normal.     Left: Normal.      Comments: Margorie John, CMA present during examination. Abdominal:     General: Bowel sounds are normal.     Palpations: Abdomen is soft.  Genitourinary:    Comments: Patient declined examination. Musculoskeletal:        General: Normal range of motion.     Cervical back: Normal range of motion and neck supple.  Skin:    General: Skin is warm and dry.     Capillary Refill: Capillary refill takes less than 2 seconds.  Neurological:     General: No focal deficit present.     Mental Status: She is alert and oriented to person, place, and time.  Psychiatric:        Mood and Affect: Mood normal.        Behavior: Behavior normal.     ASSESSMENT AND PLAN: 1. Annual physical exam: - Counseled on 150 minutes of exercise per week as tolerated, healthy eating (including decreased daily intake of saturated fats, cholesterol, added sugars, sodium), STI prevention, and routine healthcare maintenance.  2. Screening for metabolic  disorder: - BMP last obtained 10/23/2020. - Hepatic Function Panel to screen liver function. - Hepatic Function Panel  3. Screening for deficiency anemia: - CBC last obtained 10/23/2020.  4. Diabetes mellitus screening: - Hemoglobin A1c to screen for pre-diabetes/diabetes. - Hemoglobin A1c  5. Screening cholesterol level: - Lipid panel to screen for high cholesterol.  - Lipid Panel  6. Thyroid disorder screen: - TSH last obtained 10/23/2020.  7.  Need for hepatitis C screening test: - HCV antibody to screen for hepatitis C.  - HCV Ab w/Rflx to Verification  8. Encounter for screening mammogram for malignant neoplasm of breast: - Referral for breast cancer screening by mammogram.  - MM Digital Screening; Future  9. Cervical cancer screening: - Referral to Gynecology for cervical cancer screening by PAP smear. - Ambulatory referral to Gynecology  10. Encounter for screening for HIV: - HIV antibody to screen for human immunodeficiency virus.  - HIV antibody (with reflex)  11. Essential hypertension: - Blood pressure at goal.  - Continue Hydrochlorothiazide and Amlodipine as prescribed.  - Counseled on blood pressure goal of less than 130/80, low-sodium, DASH diet, medication compliance, 150 minutes of moderate intensity exercise per week as tolerated. Discussed medication compliance, adverse effects. - Keep appointment with cardiologist Sande Rives, MD scheduled 01/07/2021. - Follow-up with primary provider as scheduled.  - hydrochlorothiazide (HYDRODIURIL) 50 MG tablet; Take 1 tablet (50 mg total) by mouth daily.  Dispense: 90 tablet; Refill: 0 - amLODipine (NORVASC) 5 MG tablet; Take 1 tablet (5 mg total) by mouth daily.  Dispense: 90 tablet; Refill: 0  12. Anxiety and depression: - Stable.  - Denies thoughts of self-harm, suicidal ideations, and homicidal ideations. - Declines pharmacological and counseling services at this time.  - Follow-up with primary  provider as scheduled.   Patient was given the opportunity to ask questions.  Patient verbalized understanding of the plan and was able to repeat key elements of the plan. Patient was given clear instructions to go to Emergency Department or return to medical center if symptoms don't improve, worsen, or new problems develop.The patient verbalized understanding.   Orders Placed This Encounter  Procedures  . Hepatic Function Panel  . Lipid Panel  . Hemoglobin A1c  . HCV Ab w/Rflx to Verification  . HIV antibody (with reflex)  . Interpretation:  . Ambulatory referral to Gynecology     Requested Prescriptions   Signed Prescriptions Disp Refills  . hydrochlorothiazide (HYDRODIURIL) 50 MG tablet 90 tablet 0    Sig: Take 1 tablet (50 mg total) by mouth daily.  Marland Kitchen amLODipine (NORVASC) 5 MG tablet 90 tablet 0    Sig: Take 1 tablet (5 mg total) by mouth daily.    Follow-up with primary provider as scheduled.  Rema Fendt, NP

## 2021-01-04 NOTE — Progress Notes (Signed)
Physical  Needs refill on amlodipine and hydrochlorothiazide

## 2021-01-04 NOTE — Patient Instructions (Addendum)
Annual physical exam and labs today.   Continue Hydrochlorothiazide and Amlodipine for high blood pressure.   Referral for PAP smear.   Referral for mammogram.  Keep appointment with Cardiology.  Follow-up with primary provider as scheduled.   Preventive Care 37-45 Years Old, Female Preventive care refers to lifestyle choices and visits with your health care provider that can promote health and wellness. This includes:  A yearly physical exam. This is also called an annual wellness visit.  Regular dental and eye exams.  Immunizations.  Screening for certain conditions.  Healthy lifestyle choices, such as: ? Eating a healthy diet. ? Getting regular exercise. ? Not using drugs or products that contain nicotine and tobacco. ? Limiting alcohol use. What can I expect for my preventive care visit? Physical exam Your health care provider will check your:  Height and weight. These may be used to calculate your BMI (body mass index). BMI is a measurement that tells if you are at a healthy weight.  Heart rate and blood pressure.  Body temperature.  Skin for abnormal spots. Counseling Your health care provider may ask you questions about your:  Past medical problems.  Family's medical history.  Alcohol, tobacco, and drug use.  Emotional well-being.  Home life and relationship well-being.  Sexual activity.  Diet, exercise, and sleep habits.  Work and work Statistician.  Access to firearms.  Method of birth control.  Menstrual cycle.  Pregnancy history. What immunizations do I need? Vaccines are usually given at various ages, according to a schedule. Your health care provider will recommend vaccines for you based on your age, medical history, and lifestyle or other factors, such as travel or where you work.   What tests do I need? Blood tests  Lipid and cholesterol levels. These may be checked every 5 years, or more often if you are over 80 years  old.  Hepatitis C test.  Hepatitis B test. Screening  Lung cancer screening. You may have this screening every year starting at age 27 if you have a 30-pack-year history of smoking and currently smoke or have quit within the past 15 years.  Colorectal cancer screening. ? All adults should have this screening starting at age 70 and continuing until age 58. ? Your health care provider may recommend screening at age 12 if you are at increased risk. ? You will have tests every 1-10 years, depending on your results and the type of screening test.  Diabetes screening. ? This is done by checking your blood sugar (glucose) after you have not eaten for a while (fasting). ? You may have this done every 1-3 years.  Mammogram. ? This may be done every 1-2 years. ? Talk with your health care provider about when you should start having regular mammograms. This may depend on whether you have a family history of breast cancer.  BRCA-related cancer screening. This may be done if you have a family history of breast, ovarian, tubal, or peritoneal cancers.  Pelvic exam and Pap test. ? This may be done every 3 years starting at age 37. ? Starting at age 109, this may be done every 5 years if you have a Pap test in combination with an HPV test. Other tests  STD (sexually transmitted disease) testing, if you are at risk.  Bone density scan. This is done to screen for osteoporosis. You may have this scan if you are at high risk for osteoporosis. Talk with your health care provider about your test results,  treatment options, and if necessary, the need for more tests. Follow these instructions at home: Eating and drinking  Eat a diet that includes fresh fruits and vegetables, whole grains, lean protein, and low-fat dairy products.  Take vitamin and mineral supplements as recommended by your health care provider.  Do not drink alcohol if: ? Your health care provider tells you not to drink. ? You are  pregnant, may be pregnant, or are planning to become pregnant.  If you drink alcohol: ? Limit how much you have to 0-1 drink a day. ? Be aware of how much alcohol is in your drink. In the [place] one drink equals one 12 oz bottle of beer (355 mL), one 5 oz glass of wine (148 mL), or one 1 oz glass of hard liquor (44 mL).   Lifestyle  Take daily care of your teeth and gums. Brush your teeth every morning and night with fluoride toothpaste. Floss one time each day.  Stay active. Exercise for at least 30 minutes 5 or more days each week.  Do not use any products that contain nicotine or tobacco, such as cigarettes, e-cigarettes, and chewing tobacco. If you need help quitting, ask your health care provider.  Do not use drugs.  If you are sexually active, practice safe sex. Use a condom or other form of protection to prevent STIs (sexually transmitted infections).  If you do not wish to become pregnant, use a form of birth control. If you plan to become pregnant, see your health care provider for a prepregnancy visit.  If told by your health care provider, take low-dose aspirin daily starting at age 77.  Find healthy ways to cope with stress, such as: ? Meditation, yoga, or listening to music. ? Journaling. ? Talking to a trusted person. ? Spending time with friends and family. Safety  Always wear your seat belt while driving or riding in a vehicle.  Do not drive: ? If you have been drinking alcohol. Do not ride with someone who has been drinking. ? When you are tired or distracted. ? While texting.  Wear a helmet and other protective equipment during sports activities.  If you have firearms in your house, make sure you follow all gun safety procedures. What's next?  Visit your health care provider once a year for an annual wellness visit.  Ask your health care provider how often you should have your eyes and teeth checked.  Stay up to date on all vaccines. This information is  not intended to replace advice given to you by your health care provider. Make sure you discuss any questions you have with your health care provider. Document Revised: 07/10/2020 Document Reviewed: 06/17/2018 Elsevier Patient Education  2021 [employer].

## 2021-01-05 LAB — HEPATIC FUNCTION PANEL
ALT: 13 IU/L (ref 0–32)
AST: 21 IU/L (ref 0–40)
Albumin: 4.8 g/dL (ref 3.8–4.8)
Alkaline Phosphatase: 92 IU/L (ref 44–121)
Bilirubin Total: 0.6 mg/dL (ref 0.0–1.2)
Bilirubin, Direct: 0.13 mg/dL (ref 0.00–0.40)
Total Protein: 8.5 g/dL (ref 6.0–8.5)

## 2021-01-05 LAB — HCV INTERPRETATION

## 2021-01-05 LAB — LIPID PANEL
Chol/HDL Ratio: 3.9 ratio (ref 0.0–4.4)
Cholesterol, Total: 185 mg/dL (ref 100–199)
HDL: 47 mg/dL (ref >39)
LDL Chol Calc (NIH): 130 mg/dL — ABNORMAL HIGH (ref 0–99)
Triglycerides: 43 mg/dL (ref 0–149)
VLDL Cholesterol Cal: 8 mg/dL (ref 5–40)

## 2021-01-05 LAB — HEMOGLOBIN A1C
Est. average glucose Bld gHb Est-mCnc: 117 mg/dL
Hgb A1c MFr Bld: 5.7 % — ABNORMAL HIGH (ref 4.8–5.6)

## 2021-01-05 LAB — HIV ANTIBODY (ROUTINE TESTING W REFLEX): HIV Screen 4th Generation wRfx: NONREACTIVE

## 2021-01-05 LAB — HCV AB W/RFLX TO VERIFICATION: HCV Ab: <0.1 s/co ratio (ref 0.0–0.9)

## 2021-01-05 NOTE — Progress Notes (Signed)
Your hemoglobin A1c is consistent with pre-diabetes. Practice healthy eating habits of fresh fruit and vegetables, lean baked meats such as chicken, fish, and [place]; limit breads, rice, pastas, and desserts; practice regular aerobic exercise (at least 150 minutes a week as tolerated). Patient encouraged to have hemoglobin A1c rechecked in 6 months or sooner if needed.   LDL cholesterol higher than expected. This may increase risk of heart attack and/or stroke. Consider eating more fruits, vegetables, and lean baked meats such as chicken or fish. Moderate intensity exercise at least 150 minutes as tolerated per week may help as well. Patient encouraged to have cholesterol rechecked in 6 months or sooner if needed.  Liver function normal.   Hepatitis C negative.   HIV negative.

## 2021-01-06 NOTE — Progress Notes (Signed)
Cardiology Office Note:   Date:  01/07/2021  NAME:  Vanessa Burton    MRN: [id number] DOB:  02/26/1976   PCP:  Rema Fendt, NP  Cardiologist:  No primary care provider on file.   Referring MD: Rema Fendt, NP   Chief Complaint  Patient presents with  . New Patient (Initial Visit)  . Headache   History of Present Illness:   Vanessa Burton is a 45 y.o. female with a hx of HTN, HLD who is being seen today for the evaluation of abnormal EKG at the request of Rema Fendt, NP.  She has been diagnosed with high blood pressure.  She was seen in the emergency room in January for headache and elevated blood pressure.  Her primary care physician now has her on amlodipine 5 mg daily and HCTZ 50 mg daily.  BP in office 118/80.  She reports that she is doing well.  Still having early morning headaches.  She takes her diuretic at night and does not get good sleep.  She does have sleep apnea.  She needs reevaluation of her mask.  She requests referral today.  She seems to be doing much better on her blood pressure medications again, this is well controlled.  EKG in office demonstrates normal sinus rhythm with no acute ischemic changes or evidence of infarction.  She recently moved to [facility].  She had a stress test in September 2020 in [place] that was a stress echocardiogram.  She had normal LV and RV function.  Showed normal pressures.  She had no evidence of inducible ischemia.  She reports she is not exercising currently.  She reports her diet could be better.  This does consist of fast food and salty meals.  She is working on this.  She also learned that she is prediabetic with an A1c of 5.7.  Most recent LDL cholesterol shows a value of 130. 10-year ASCVD risk score 1.8% which is low risk.  She is not married.  She has 2 children.  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  She has plans to get more active.  She denies any alarming symptoms such as exertional chest pain or  pressure.  No shortness of breath reported.  Edema is controlled on her diuretic.  Problem List 1. HTN 2. HLD -Tchol 185, HDL 47, LDL 130, TG 43  3.  OSA 4.  Obesity -BMI 35  Past Medical History: Past Medical History:  Diagnosis Date  . Hypertension   . Irritable bowel syndrome (IBS)   . OSA (obstructive sleep apnea)   . Vitamin D deficiency     Past Surgical History: Past Surgical History:  Procedure Laterality Date  . TUBAL LIGATION      Current Medications: Current Meds  Medication Sig  . amLODipine (NORVASC) 5 MG tablet Take 1 tablet (5 mg total) by mouth daily.  . hydrochlorothiazide (HYDRODIURIL) 50 MG tablet Take 1 tablet (50 mg total) by mouth daily.  Marland Kitchen triamcinolone cream (KENALOG) 0.1 % Apply topically 2 (two) times daily as needed.     Allergies:    Patient has no known allergies.   Social History: Social History   Socioeconomic History  . Marital status: Single    Spouse name: Not on file  . Number of children: 2  . Years of education: Not on file  . Highest education level: Not on file  Occupational History  . Occupation: school bus driver  Tobacco Use  .  Smoking status: Never Smoker  . Smokeless tobacco: Never Used  Vaping Use  . Vaping Use: Never used  Substance and Sexual Activity  . Alcohol use: Never  . Drug use: Never  . Sexual activity: Not on file  Other Topics Concern  . Not on file  Social History Narrative  . Not on file   Social Determinants of Health   Financial Resource Strain: Not on file  Food Insecurity: Not on file  Transportation Needs: Not on file  Physical Activity: Not on file  Stress: Not on file  Social Connections: Not on file     Family History: The patient's family history includes Diabetes in her father; Heart attack in her mother; Hypertension in her mother; Kidney disease in her father.  ROS:   All other ROS reviewed and negative. Pertinent positives noted in the HPI.     EKGs/Labs/Other Studies  Reviewed:   The following studies were personally reviewed by me today:  EKG:  EKG is ordered today.  The ekg ordered today demonstrates normal sinus rhythm heart rate 85, no acute ischemic changes, no evidence of infarction and was personally reviewed by me.   Stress Echo 06/30/2019 [facility])   Normal ejection fraction (55-60%). No regional wall motion  abnormalities. Normal cavity size. Normal wall thickness noted. Normal  left ventricular diastolic function and filling pressure.   Right Ventricle: Normal cavity size and wall thickness. Systolic  function is normal.   Normal valve structure and function. Normal estimated pulmonary artery  systolic pressure.   Borderline ischemic EKG changes with exercise at 84% maximum predicted  heart rate achieving 10.3 METS. No chest pain or pressure with exertion.   No significant arrhythmias with exercise. Normotensive response to  exercise. Good exercise capacity.   Negative exercise stress echocardiogram. Post stress images showed normal  exercise response and no echocardiographic evidence of exercise induced  myocardial ischemia at adequate and diagnostic workload   Recent Labs: 10/23/2020: BUN 11; Creatinine, Ser 0.79; Hemoglobin 11.6; Platelets 309; Potassium 3.9; Sodium 137; TSH 2.653 01/04/2021: ALT 13   Recent Lipid Panel    Component Value Date/Time   CHOL 185 01/04/2021 1452   TRIG 43 01/04/2021 1452   HDL 47 01/04/2021 1452   CHOLHDL 3.9 01/04/2021 1452   LDLCALC 130 (H) 01/04/2021 1452    Physical Exam:   VS:  BP 118/80 (BP Location: Left Arm, Patient Position: Sitting, Cuff Size: Large)   Pulse 85   Ht 5\' 6"  (1.676 m)   Wt 214 lb (97.1 kg)   BMI 34.54 kg/m    Wt Readings from Last 3 Encounters:  01/07/21 214 lb (97.1 kg)  01/04/21 215 lb 6.4 oz (97.7 kg)  12/19/20 219 lb (99.3 kg)    General: Well nourished, well developed, in no acute distress Head: Atraumatic, normal size  Eyes: PEERLA, EOMI  Neck:  Supple, no JVD Endocrine: No thryomegaly Cardiac: Normal S1, S2; RRR; no murmurs, rubs, or gallops Lungs: Clear to auscultation bilaterally, no wheezing, rhonchi or rales  Abd: Soft, nontender, no hepatomegaly  Ext: No edema, pulses 2+ Musculoskeletal: No deformities, BUE and BLE strength normal and equal Skin: Warm and dry, no rashes   Neuro: Alert and oriented to person, place, time, and situation, CNII-XII grossly intact, no focal deficits  Psych: Normal mood and affect   ASSESSMENT:   Vanessa Burton is a 45 y.o. female who presents for the following: 1. Nonspecific abnormal electrocardiogram (ECG) (EKG)   2. Primary hypertension  3. Edema, unspecified type   4. OSA (obstructive sleep apnea)   5. Obesity (BMI 30-39.9)     PLAN:   1. Nonspecific abnormal electrocardiogram (ECG) (EKG) -EKG shows normal sinus rhythm in office.  Cardiovascular examination is normal.  Recent stress echo in [place] in 2020 was normal.  No evidence of congestive heart failure.  2. Primary hypertension -BP well controlled on amlodipine and HCTZ.  I did offer to increase her amlodipine and decrease her diuretics as she is not up going to the bathroom at night.  She reports she would like to continue her current dose for now.  She will discuss this with her primary care physician. -Her 10-year ASCVD risk score is low at 1.8%.  She does not need statin therapy at this time.  She should continue with conservative treatment of diet and exercise.  3. Edema, unspecified type -No evidence of edema today.  Suspect this is venous insufficiency related to elevated blood pressure and high salt intake.  She will work to reduce salt intake.  I also recommended regular exercise and weight loss.  4. OSA (obstructive sleep apnea) -Referral to Dr. Carolanne Grumbling for reevaluation of her CPAP.  This may just be related to diuretic therapy at night.  She may need reevaluation.  5. Obesity (BMI 30-39.9) -Diet and exercise  recommended.  I think many of her troubles could be cured from weight loss.  Disposition: Return if symptoms worsen or fail to improve.  Medication Adjustments/Labs and Tests Ordered: Current medicines are reviewed at length with the patient today.  Concerns regarding medicines are outlined above.  Orders Placed This Encounter  Procedures  . Ambulatory referral to Sleep Studies  . EKG 12-Lead   No orders of the defined types were placed in this encounter.   Patient Instructions  Medication Instructions:  The current medical regimen is effective;  continue present plan and medications.  *If you need a refill on your cardiac medications before your next appointment, please call your pharmacy*  Follow-Up: At [facility], you and your health needs are our priority.  As part of our continuing mission to provide you with exceptional heart care, we have created designated Provider Care Teams.  These Care Teams include your primary Cardiologist (physician) and Advanced Practice Providers (APPs -  Physician Assistants and Nurse Practitioners) who all work together to provide you with the care you need, when you need it.  We recommend signing up for the patient portal called "MyChart".  Sign up information is provided on this After Visit Summary.  MyChart is used to connect with patients for Virtual Visits (Telemedicine).  Patients are able to view lab/test results, encounter notes, upcoming appointments, etc.  Non-urgent messages can be sent to your provider as well.   To learn more about what you can do with MyChart, go to [email or web address].    Your next appointment:   As needed  The format for your next appointment:   In Person  Provider:   Lennie Odor, MD   Other Instructions Referral to Armanda Magic, MD- her office will contact you for an appointment.     Signed, Lenna Gilford. Flora Lipps, MD, [facility]  [facility]  [address], [address] [place], [place]  [address] [phone]  01/07/2021 9:47 AM

## 2021-01-07 ENCOUNTER — Other Ambulatory Visit: Payer: Self-pay

## 2021-01-07 ENCOUNTER — Ambulatory Visit (INDEPENDENT_AMBULATORY_CARE_PROVIDER_SITE_OTHER): Payer: Medicaid - Out of State | Admitting: Cardiovascular Disease

## 2021-01-07 ENCOUNTER — Encounter: Payer: Self-pay | Admitting: Cardiovascular Disease

## 2021-01-07 VITALS — BP 118/80 | HR 85 | Ht 66.0 in | Wt 214.0 lb

## 2021-01-07 DIAGNOSIS — G4733 Obstructive sleep apnea (adult) (pediatric): Secondary | ICD-10-CM

## 2021-01-07 DIAGNOSIS — R609 Edema, unspecified: Secondary | ICD-10-CM | POA: Diagnosis not present

## 2021-01-07 DIAGNOSIS — I10 Essential (primary) hypertension: Secondary | ICD-10-CM

## 2021-01-07 DIAGNOSIS — R9431 Abnormal electrocardiogram [ECG] [EKG]: Secondary | ICD-10-CM | POA: Diagnosis not present

## 2021-01-07 DIAGNOSIS — E785 Hyperlipidemia, unspecified: Secondary | ICD-10-CM

## 2021-01-07 DIAGNOSIS — E669 Obesity, unspecified: Secondary | ICD-10-CM

## 2021-01-07 NOTE — Patient Instructions (Signed)
Medication Instructions:  The current medical regimen is effective;  continue present plan and medications.  *If you need a refill on your cardiac medications before your next appointment, please call your pharmacy*  Follow-Up: At [facility], you and your health needs are our priority.  As part of our continuing mission to provide you with exceptional heart care, we have created designated Provider Care Teams.  These Care Teams include your primary Cardiologist (physician) and Advanced Practice Providers (APPs -  Physician Assistants and Nurse Practitioners) who all work together to provide you with the care you need, when you need it.  We recommend signing up for the patient portal called "MyChart".  Sign up information is provided on this After Visit Summary.  MyChart is used to connect with patients for Virtual Visits (Telemedicine).  Patients are able to view lab/test results, encounter notes, upcoming appointments, etc.  Non-urgent messages can be sent to your provider as well.   To learn more about what you can do with MyChart, go to [email or web address].    Your next appointment:   As needed  The format for your next appointment:   In Person  Provider:   Lennie Odor, MD   Other Instructions Referral to Armanda Magic, MD- her office will contact you for an appointment.

## 2021-02-06 ENCOUNTER — Ambulatory Visit (HOSPITAL_BASED_OUTPATIENT_CLINIC_OR_DEPARTMENT_OTHER): Payer: Medicaid Other | Admitting: Obstetrics & Gynecology

## 2021-03-16 ENCOUNTER — Encounter: Payer: Self-pay | Admitting: Emergency Medicine

## 2021-03-16 ENCOUNTER — Telehealth: Payer: Medicaid - Out of State | Admitting: Emergency Medicine

## 2021-03-16 DIAGNOSIS — K5900 Constipation, unspecified: Secondary | ICD-10-CM

## 2021-03-16 DIAGNOSIS — R1030 Lower abdominal pain, unspecified: Secondary | ICD-10-CM

## 2021-03-16 DIAGNOSIS — R3989 Other symptoms and signs involving the genitourinary system: Secondary | ICD-10-CM

## 2021-03-16 NOTE — Progress Notes (Addendum)
Vanessa Burton, Vanessa Burton are scheduled for a virtual visit with your provider today.    Just as we do with appointments in the office, we must obtain your consent to participate.  Your consent will be active for this visit and any virtual visit you may have with one of our providers in the next 365 days.    If you have a MyChart account, I can also send a copy of this consent to you electronically.  All virtual visits are billed to your insurance company just like a traditional visit in the office.  As this is a virtual visit, video technology does not allow for your provider to perform a traditional examination.  This may limit your provider's ability to fully assess your condition.  If your provider identifies any concerns that need to be evaluated in person or the need to arrange testing such as labs, EKG, etc, we will make arrangements to do so.    Although advances in technology are sophisticated, we cannot ensure that it will always work on either your end or our end.  If the connection with a video visit is poor, we may have to switch to a telephone visit.  With either a video or telephone visit, we are not always able to ensure that we have a secure connection.   I need to obtain your verbal consent now.   Are you willing to proceed with your visit today?   Leeann Bady has provided verbal consent on 03/16/2021 for a virtual visit (video or telephone).   Lurene Shadow, PA-C 03/16/2021  7:54 PM   Date:  03/16/2021   ID:  Vanessa Burton, DOB [birth year], MRN [id number]  Patient Location: Home Provider Location: Home Office   Participants: Patient and Provider for Visit and Wrap up  Method of visit: Video  Location of Patient: Home Location of Provider: Home Office Consent was obtain for visit over the video. Services rendered by provider: Visit was performed via video  A video enabled telemedicine application was used and I verified that I am speaking with the correct person using two  identifiers.  PCP:  Rema Fendt, NP   Chief Complaint:  Stomach cramping  History of Present Illness:    Vanessa Burton is a 45 y.o. female with history as stated below. Presents video telehealth for an acute care visit  History of IBS and GERD.    Onset of symptoms was 3 days ago and symptoms have been persistent and include: across lower abdominal pain, cramping and sharp with some pressure, 10/10. Pain is constant, worse at night.  Feels constipated. She has tried Dulcolax once with some success yesterday.  Prior to last night BM was 4 days ago.  Mild nausea. Loss of appetite due to the nausea.   Pain is most concerning for the patient.  She has also had difficulty urinating, feels like she cannot fully empty her bladder.   Abdominal surgery- tubal ligation in 2005.    Denies having fever, chills or vomiting.   No other aggravating or relieving factors.  No other c/o.  The patient does not have symptoms concerning for COVID-19 infection (fever, chills, cough, or new shortness of breath).  Patient has not been tested for COVID during this illness.  Past Medical, Surgical, Social History, Allergies, and Medications have been Reviewed.  Patient Active Problem List   Diagnosis Date Noted  . GERD (gastroesophageal reflux disease) 06/29/2018  . Bilateral leg edema 04/01/2018  . Obesity 04/01/2018  . Vitamin  D deficiency 04/01/2018  . Anxiety 03/30/2018  . Depression 03/30/2018  . Essential hypertension 03/30/2018  . Irritable bowel syndrome with constipation 03/30/2018  . OSA (obstructive sleep apnea) 03/30/2018  . Acne 01/06/2013    Social History   Tobacco Use  . Smoking status: Never Smoker  . Smokeless tobacco: Never Used  Substance Use Topics  . Alcohol use: Never     Current Outpatient Medications:  .  amLODipine (NORVASC) 5 MG tablet, Take 1 tablet (5 mg total) by mouth daily., Disp: 90 tablet, Rfl: 0 .  hydrochlorothiazide (HYDRODIURIL) 50 MG tablet, Take 1  tablet (50 mg total) by mouth daily., Disp: 90 tablet, Rfl: 0 .  triamcinolone cream (KENALOG) 0.1 %, Apply topically 2 (two) times daily as needed., Disp: 30 g, Rfl: 6   No Known Allergies   ROS See HPI for history of present illness.  Physical Exam Constitutional:      General: She is not in acute distress.    Appearance: She is well-developed. She is not ill-appearing, toxic-appearing or diaphoretic.  Pulmonary:     Effort: Pulmonary effort is normal. No respiratory distress.  Abdominal:     Tenderness: There is abdominal tenderness in the right lower quadrant, suprapubic area and left lower quadrant.  Neurological:     Mental Status: She is alert.  Psychiatric:        Mood and Affect: Mood normal.        Behavior: Behavior normal.               A&P  1. Abdominal pain, lower    -Reports 10/10 constant pain for about 4 days  -Nausea and loss of appetite.   -Denies fever, chills or vomiting.  -Advised to go to emergency department for further evaluation and treatment this evening.  2. Constipation, unspecified constipation type  -Prior to last night, last BM was 4 days ago, abnormal for patient. Usually goes every 2-3 days at most with her IBS. 3. Sensation of pressure in bladder area  -Feels she cannot fully empty bladder  -Denies pain or blood with urination   Stressed importance of in-person evaluation due to reported 10/10.  Do not believe reported symptoms c/w mild constipation or UTI at this time. Discussed pt with Dr. Eber Hong, agreeable pt needs in-person evaluation.     Patient voiced understanding and agreement to plan.   Time:   Today, I have spent 20 minutes with the patient with telehealth technology discussing the above problems, reviewing the chart, previous notes, medications and orders.    Tests Ordered: No orders of the defined types were placed in this encounter.   Medication Changes: No orders of the defined types were placed in this  encounter.    Disposition:  Follow up Advised to go to emergency department this evening for further work up. Pt hesitant to go this evening due to financial concern and potential long wait times.   Cherly Hensen, PA-C  03/16/2021 7:54 PM

## 2021-03-16 NOTE — Progress Notes (Signed)
Attempted to connect with patient without success.

## 2021-03-16 NOTE — Patient Instructions (Signed)
  As discussed this evening it is recommended you seek in-person evaluation in the emergency department, or at least an urgent care if one is still open this evening.

## 2021-03-19 NOTE — Progress Notes (Signed)
Patient ID: Vanessa Burton, female    DOB: [birth year]  MRN: [id number]  CC: Abdominal Pain  Subjective: Vanessa Burton is a 45 y.o. female who presents for abdominal pain.  Her concerns today include:   1. ABDOMINAL PAIN: Visit 03/16/2021 at [facility] per PA note: Presents video telehealth for an acute care visit  History of IBS and GERD.    Onset of symptoms was 3 days ago and symptoms have been persistent and include: across lower abdominal pain, cramping and sharp with some pressure, 10/10. Pain is constant, worse at night.  Feels constipated. She has tried Dulcolax once with some success yesterday.  Prior to last night BM was 4 days ago.  Mild nausea. Loss of appetite due to the nausea.   Pain is most concerning for the patient.  She has also had difficulty urinating, feels like she cannot fully empty her bladder.   Abdominal surgery- tubal ligation in 2005.    Denies having fever, chills or vomiting.   No other aggravating or relieving factors.  No other c/o.  The patient does not have symptoms concerning for COVID-19 infection (fever, chills, cough, or new shortness of breath).  Patient has not been tested for COVID during this illness.  A&P: 1. Abdominal pain, lower:                  -Reports 10/10 constant pain for about 4 days             -Nausea and loss of appetite.               -Denies fever, chills or vomiting.             -Advised to go to emergency department for further evaluation and treatment this evening.  2. Constipation, unspecified constipation type:             -Prior to last night, last BM was 4 days ago, abnormal for patient. Usually goes every 2-3 days at most with her IBS. 3. Sensation of pressure in bladder area:             -Feels she cannot fully empty bladder             -Denies pain or blood with urination   Stressed importance of in-person evaluation due to reported 10/10.  Do not believe reported symptoms c/w mild  constipation or UTI at this time. Discussed pt with Dr. Eber Hong, agreeable pt needs in-person evaluation.   03/20/2021: Reports since most recent telehealth visit 4 days ago symptoms are persisting. Reports abdominal pain for at least 1 week.   Also, concern for vaginal itching and burning sensation with incomplete urination. Denies blood in urine and vaginal odor.   Location: bilateral lower stomach, initially was constant for 3 days, currently comes and goes    Onset: 1 week Description: shooting pain Nausea/Vomiting: nausea sometimes Diarrhea: no Constipation: yes  Melena/BRBPR: no Hematemesis: no Fever/Chills: no Dysuria: yes Back pain: no  2. IRRITABLE BOWEL SYNDROME: Concern for constipation and taking Dulcolax without much relief. Bowel movements described as oval pellets, denies blood in stool or darker than normal stools. Endorses straining with bowel movements. Doesn't have much of an appetite but trying to get in fruit and vegetables when able. History of irritable bowel syndrome. Was seeing Gastroenterology for this in the past before moving from [place] to [place] around 2020. Had a colonoscopy around 2018 or 2019 and was told that the colonoscopy  looked ok but there was some concern for polyps and diverticulitis.    Patient Active Problem List   Diagnosis Date Noted  . GERD (gastroesophageal reflux disease) 06/29/2018  . Bilateral leg edema 04/01/2018  . Obesity 04/01/2018  . Vitamin D deficiency 04/01/2018  . Anxiety 03/30/2018  . Depression 03/30/2018  . Essential hypertension 03/30/2018  . Irritable bowel syndrome with constipation 03/30/2018  . OSA (obstructive sleep apnea) 03/30/2018  . Acne 01/06/2013     Current Outpatient Medications on File Prior to Visit  Medication Sig Dispense Refill  . amLODipine (NORVASC) 5 MG tablet Take 1 tablet (5 mg total) by mouth daily. 90 tablet 0  . hydrochlorothiazide (HYDRODIURIL) 50 MG tablet Take 1 tablet  (50 mg total) by mouth daily. 90 tablet 0  . triamcinolone cream (KENALOG) 0.1 % Apply topically 2 (two) times daily as needed. 30 g 6   No current facility-administered medications on file prior to visit.    No Known Allergies  Social History   Socioeconomic History  . Marital status: Single    Spouse name: Not on file  . Number of children: 2  . Years of education: Not on file  . Highest education level: Not on file  Occupational History  . Occupation: school bus driver  Tobacco Use  . Smoking status: Never Smoker  . Smokeless tobacco: Never Used  Vaping Use  . Vaping Use: Never used  Substance and Sexual Activity  . Alcohol use: Never  . Drug use: Never  . Sexual activity: Not on file  Other Topics Concern  . Not on file  Social History Narrative  . Not on file   Social Determinants of Health   Financial Resource Strain: Not on file  Food Insecurity: Not on file  Transportation Needs: Not on file  Physical Activity: Not on file  Stress: Not on file  Social Connections: Not on file  Intimate Partner Violence: Not on file    Family History  Problem Relation Age of Onset  . Hypertension Mother   . Heart attack Mother   . Kidney disease Father   . Diabetes Father     Past Surgical History:  Procedure Laterality Date  . TUBAL LIGATION      ROS: Review of Systems Negative except as stated above  PHYSICAL EXAM: BP 127/83 (BP Location: Left Arm, Patient Position: Sitting, Cuff Size: Normal)   Pulse 77   Temp 97.8 F (36.6 C)   Resp 15   Ht 5' 6.81" (1.697 m)   Wt 206 lb 9.6 oz (93.7 kg)   SpO2 98%   BMI 32.54 kg/m   Physical Exam HENT:     Head: Normocephalic and atraumatic.  Cardiovascular:     Rate and Rhythm: Normal rate and regular rhythm.  Pulmonary:     Effort: Pulmonary effort is normal.     Breath sounds: Normal breath sounds.  Abdominal:     General: Bowel sounds are normal.     Palpations: Abdomen is soft.     Tenderness: There  is abdominal tenderness in the right lower quadrant, periumbilical area, suprapubic area and left lower quadrant.  Neurological:     General: No focal deficit present.     Mental Status: She is alert.  Psychiatric:        Mood and Affect: Mood normal.        Behavior: Behavior normal.    ASSESSMENT AND PLAN: 1. Abdominal pain, unspecified abdominal location: - Ultrasound abdomen for further  evaluation and management.  - Today urinalysis negative for urinary tract infection.  - Cervicovaginal self-swab to screen for chlamydia, gonorrhea, trichomonas, bacterial vaginitis, and candida vaginitis. Results pending. - Patient was given clear instructions to go to Emergency Department if symptoms don't improve, worsen, or new problems develop.The patient verbalized understanding. - POCT URINALYSIS DIP (CLINITEK) - Cervicovaginal ancillary only - US Abdomen Complete; Future  2. Irritable bowel syndrome with constipation: - Begin  Polyethylene Glycol Powder as prescribed for constipation. Counseled to not take with over-the-counter Dulcolax. Patient agreeable.  - History of irritable bowel syndrome and seeing Gastroenterology in the past for the same.  - Referral to Gastroenterology for further evaluation and management. Their office should call the patient within 2 weeks with appointment details.  - Patient was given clear instructions to go to Emergency Department if symptoms don't improve, worsen, or new problems develop.The patient verbalized understanding. - Patient may want to try some of the following:  Drink plenty of fluid, preferably water, throughout the day  Eat foods high in fiber such as fruits, vegetables, and grains  Exercise, such as walking, is a good way to keep your bowels regular  Drink warm fluids, especially warm prune juice or pear juice, or decaf coffee  Eat a 1/2 cup of real oatmeal (not instant), 1/2 cup applesauce, and 1/2-1 cup warm prune juice every day -  polyethylene glycol powder (GLYCOLAX/MIRALAX) 17 GM/SCOOP powder; Take 17 g by mouth daily.  Dispense: 3350 g; Refill: 0 - Ambulatory referral to Gastroenterology    Patient was given the opportunity to ask questions.  Patient verbalized understanding of the plan and was able to repeat key elements of the plan. Patient was given clear instructions to go to Emergency Department or return to medical center if symptoms don't improve, worsen, or new problems develop.The patient verbalized understanding.   Orders Placed This Encounter  Procedures  . US Abdomen Complete  . Ambulatory referral to Gastroenterology  . POCT URINALYSIS DIP (CLINITEK)     Requested Prescriptions   Signed Prescriptions Disp Refills  . polyethylene glycol powder (GLYCOLAX/MIRALAX) 17 GM/SCOOP powder 3350 g 0    Sig: Take 17 g by mouth daily.    Follow-up with primary provider as scheduled.   Rema Fendt, NP

## 2021-03-20 ENCOUNTER — Ambulatory Visit (INDEPENDENT_AMBULATORY_CARE_PROVIDER_SITE_OTHER): Payer: Self-pay | Admitting: Family

## 2021-03-20 ENCOUNTER — Other Ambulatory Visit: Payer: Self-pay

## 2021-03-20 ENCOUNTER — Other Ambulatory Visit (HOSPITAL_COMMUNITY)
Admission: RE | Admit: 2021-03-20 | Discharge: 2021-03-20 | Disposition: A | Discharge status: Home / Self Care | Payer: Medicaid Other | Source: Ambulatory Visit | Attending: Family | Admitting: Family

## 2021-03-20 ENCOUNTER — Telehealth: Payer: Self-pay

## 2021-03-20 VITALS — BP 127/83 | HR 77 | Temp 97.8°F | Resp 15 | Ht 66.81 in | Wt 206.6 lb

## 2021-03-20 DIAGNOSIS — K581 Irritable bowel syndrome with constipation: Secondary | ICD-10-CM

## 2021-03-20 DIAGNOSIS — Z113 Encounter for screening for infections with a predominantly sexual mode of transmission: Secondary | ICD-10-CM | POA: Insufficient documentation

## 2021-03-20 DIAGNOSIS — R109 Unspecified abdominal pain: Secondary | ICD-10-CM | POA: Diagnosis not present

## 2021-03-20 LAB — POCT URINALYSIS DIP (CLINITEK)
Bilirubin, UA: NEGATIVE
Blood, UA: NEGATIVE
Glucose, UA: NEGATIVE mg/dL
Ketones, POC UA: NEGATIVE mg/dL
Leukocytes, UA: NEGATIVE
Nitrite, UA: NEGATIVE
POC PROTEIN,UA: NEGATIVE
Spec Grav, UA: 1.015 (ref 1.010–1.025)
Urobilinogen, UA: 0.2 E.U./dL
pH, UA: 7.5 (ref 5.0–8.0)

## 2021-03-20 MED ORDER — POLYETHYLENE GLYCOL 3350 17 GM/SCOOP PO POWD: 17.0000 g | Freq: Every day | ORAL | 0 refills | Status: DC

## 2021-03-20 NOTE — Patient Instructions (Signed)

## 2021-03-20 NOTE — Progress Notes (Signed)
Symptoms started last week hasn't had BM since Friday, small BM yesterday but had to strain, hasn't been able to urinate fully

## 2021-03-20 NOTE — Telephone Encounter (Signed)
Att to contact pt to advise of scheduled U/S of abdomen, no ans phone just rings unable to leave vm will send Mychart message to notify pt

## 2021-03-20 NOTE — Progress Notes (Signed)
No urinary tract infection.

## 2021-03-21 LAB — CERVICOVAGINAL ANCILLARY ONLY
Bacterial Vaginitis (gardnerella): NEGATIVE
Candida Glabrata: NEGATIVE
Candida Vaginitis: NEGATIVE
Chlamydia: NEGATIVE
Comment: NEGATIVE
Comment: NEGATIVE
Comment: NEGATIVE
Comment: NEGATIVE
Comment: NEGATIVE
Comment: NORMAL
Neisseria Gonorrhea: NEGATIVE
Trichomonas: NEGATIVE

## 2021-03-21 NOTE — Progress Notes (Signed)
Negative gonorrhea, chlamydia, trichomonas, bacterial vaginitis, and yeast infection.

## 2021-03-26 ENCOUNTER — Ambulatory Visit (HOSPITAL_COMMUNITY): Payer: Self-pay | Attending: Family

## 2021-04-26 ENCOUNTER — Other Ambulatory Visit: Payer: Self-pay

## 2021-04-26 ENCOUNTER — Encounter: Payer: Self-pay | Admitting: Emergency Medicine

## 2021-04-26 ENCOUNTER — Ambulatory Visit
Admission: EM | Admit: 2021-04-26 | Discharge: 2021-04-26 | Disposition: A | Discharge status: Home / Self Care | Payer: 59 | Attending: Internal Medicine | Admitting: Internal Medicine

## 2021-04-26 DIAGNOSIS — J029 Acute pharyngitis, unspecified: Secondary | ICD-10-CM | POA: Insufficient documentation

## 2021-04-26 DIAGNOSIS — Z7689 Persons encountering health services in other specified circumstances: Secondary | ICD-10-CM | POA: Diagnosis not present

## 2021-04-26 DIAGNOSIS — J069 Acute upper respiratory infection, unspecified: Secondary | ICD-10-CM | POA: Insufficient documentation

## 2021-04-26 LAB — POCT INFLUENZA A/B
Influenza A, POC: NEGATIVE
Influenza B, POC: NEGATIVE

## 2021-04-26 LAB — POCT RAPID STREP A (OFFICE): Rapid Strep A Screen: NEGATIVE

## 2021-04-26 NOTE — ED Triage Notes (Signed)
Pt presents today with c/o of nasal congestion, sore throat and cough x 2 days. Denies fever.

## 2021-04-26 NOTE — ED Provider Notes (Addendum)
[name]    [name]: [id number] Arrival date & time: 04/26/21  1713      History   Chief Complaint Chief Complaint  Patient presents with   Nasal Congestion   Sore Throat   Cough    HPI Vanessa Burton is a 45 y.o. female.   Patient presents with 2-day history of nasal congestion, cough, sore throat.  Denies any known fevers at home.  Patient states that she had a coworker that is out sick currently.  Denies shortness of breath or chest pain.  Patient has not yet used any over-the-counter medications to alleviate symptoms.   Sore Throat  Cough  Past Medical History:  Diagnosis Date   Hypertension    Irritable bowel syndrome (IBS)    OSA (obstructive sleep apnea)    Vitamin D deficiency     Patient Active Problem List   Diagnosis Date Noted   GERD (gastroesophageal reflux disease) 06/29/2018   Bilateral leg edema 04/01/2018   Obesity 04/01/2018   Vitamin D deficiency 04/01/2018   Anxiety 03/30/2018   Depression 03/30/2018   Essential hypertension 03/30/2018   Irritable bowel syndrome with constipation 03/30/2018   OSA (obstructive sleep apnea) 03/30/2018   Acne 01/06/2013    Past Surgical History:  Procedure Laterality Date   TUBAL LIGATION      OB History   No obstetric history on file.      Home Medications    Prior to Admission medications   Medication Sig Start Date End Date Taking? Authorizing Provider  amLODipine (NORVASC) 5 MG tablet Take 1 tablet (5 mg total) by mouth daily. 01/04/21   Rema Fendt, NP  hydrochlorothiazide (HYDRODIURIL) 50 MG tablet Take 1 tablet (50 mg total) by mouth daily. 01/04/21 04/04/21  Rema Fendt, NP  polyethylene glycol powder (GLYCOLAX/MIRALAX) 17 GM/SCOOP powder Take 17 g by mouth daily. 03/20/21   Rema Fendt, NP  triamcinolone cream (KENALOG) 0.1 % Apply topically 2 (two) times daily as needed. 01/09/20   Cain Saupe, MD    Family History Family History  Problem Relation Age of Onset    Hypertension Mother    Heart attack Mother    Kidney disease Father    Diabetes Father     Social History Social History   Tobacco Use   Smoking status: Never   Smokeless tobacco: Never  Vaping Use   Vaping Use: Never used  Substance Use Topics   Alcohol use: Never   Drug use: Never     Allergies   Patient has no known allergies.   Review of Systems Review of Systems Per HPI  Physical Exam Triage Vital Signs ED Triage Vitals  Enc Vitals Group     BP 04/26/21 1829 (!) 136/92     Pulse Rate 04/26/21 1829 (!) 103     Resp 04/26/21 1829 20     Temp 04/26/21 1829 99.1 F (37.3 C)     Temp Source 04/26/21 1829 Oral     SpO2 04/26/21 1829 96 %     Weight --      Height --      Head Circumference --      Peak Flow --      Pain Score 04/26/21 1827 10     Pain Loc --      Pain Edu? --      Excl. in GC? --    No data found.  Updated Vital Signs BP (!) 136/92 (BP Location: Left Arm)  Pulse (!) 103   Temp 99.1 F (37.3 C) (Oral)   Resp 20   LMP 04/05/2021 (Exact Date)   SpO2 96%   Visual Acuity Right Eye Distance:   Left Eye Distance:   Bilateral Distance:    Right Eye Near:   Left Eye Near:    Bilateral Near:     Physical Exam Constitutional:      General: She is not in acute distress.    Appearance: Normal appearance.  HENT:     Head: Normocephalic and atraumatic.     Right Ear: Tympanic membrane and ear canal normal.     Left Ear: Tympanic membrane and ear canal normal.     Nose: Mucosal edema and congestion present.     Right Sinus: No maxillary sinus tenderness or frontal sinus tenderness.     Left Sinus: No maxillary sinus tenderness or frontal sinus tenderness.     Mouth/Throat:     Mouth: Mucous membranes are moist.     Pharynx: No oropharyngeal exudate or posterior oropharyngeal erythema.     Tonsils: No tonsillar exudate. 0 on the right. 0 on the left.  Eyes:     Extraocular Movements: Extraocular movements intact.      Conjunctiva/sclera: Conjunctivae normal.     Pupils: Pupils are equal, round, and reactive to light.  Cardiovascular:     Rate and Rhythm: Normal rate and regular rhythm.     Pulses: Normal pulses.     Heart sounds: Normal heart sounds.  Pulmonary:     Effort: Pulmonary effort is normal. No respiratory distress.     Breath sounds: Normal breath sounds. No wheezing.  Abdominal:     General: Abdomen is flat. Bowel sounds are normal.     Palpations: Abdomen is soft.  Musculoskeletal:        General: Normal range of motion.     Cervical back: Normal range of motion.  Skin:    General: Skin is warm and dry.  Neurological:     General: No focal deficit present.     Mental Status: She is alert and oriented to person, place, and time. Mental status is at baseline.  Psychiatric:        Mood and Affect: Mood normal.        Behavior: Behavior normal.     UC Treatments / Results  Labs (all labs ordered are listed, but only abnormal results are displayed) Labs Reviewed  CULTURE, GROUP A STREP ([facility])  NOVEL CORONAVIRUS, NAA  POCT INFLUENZA A/B  POCT RAPID STREP A (OFFICE)    EKG   Radiology No results found.  Procedures Procedures (including critical care time)  Medications Ordered in UC Medications - No data to display  Initial Impression / Assessment and Plan / UC Course  I have reviewed the triage vital signs and the nursing notes.  Pertinent labs & imaging results that were available during my care of the patient were reviewed by me and considered in my medical decision making (see chart for details).     Patient presents with symptoms most consistent with a viral upper respiratory infection. Do not suspect underlying cardiopulmonary process. Symptoms seem unlikely related to ACS, CHF or COPD exacerbations, pneumonia, pneumothorax. Patient is nontoxic appearing and not in need of emergent medical intervention.  Recommended symptom control with over the counter  medications: Daily oral anti-histamine, OTC medications to alleviate symptoms such as Coricidin HBP and Mucinex that will not raise blood pressure, saline irrigations, cepacol lozenges, honey  tea.  Patient was advised to avoid any OTC medication that ends in D or DM to avoid raising blood pressure.  Fever monitoring and management.  May take Tylenol and/or ibuprofen as needed for fever.  Return if symptoms fail to improve in 1-2 weeks or you develop shortness of breath, chest pain, severe headache.    Patient was adamant about receiving antibiotic for symptoms.  Educated patient that antibiotic is not warranted at this time and physical exam was unremarkable. Physical exam most consistent with viral upper respiratory infection.  Patient requested additional provider opinion. Patterson Hammersmith, PA also assessed patient and agreed with physical exam findings, diagnosis, treatment plan. Patterson Hammersmith, PA also educated patient on treatment plan and reasoning for viral upper respiratory infection.  Patient to go to the hospital if symptoms significantly worsen.  Rapid flu test and rapid strep test were negative in urgent care.  COVID-19 viral swab and throat culture were pending.  Advised patient we will call her if these test results are positive.  Discharged with PCP followup.  Final Clinical Impressions(s) / UC Diagnoses   Final diagnoses:  Viral upper respiratory infection  Acute pharyngitis, unspecified etiology     Discharge Instructions      You likely having a viral upper respiratory infection. We recommended symptom control. I expect your symptoms to start improving in the next 1-2 weeks.   1. Take a daily allergy pill/anti-histamine like Zyrtec, Claritin, or Store brand consistently for 2 weeks  2. For congestion you may try an oral decongestant like Mucinex or coricidin HBP. Please do not take any medication that ends in D or DM because it may increase your BP. You may also try intranasal  flonase nasal spray or saline irrigations (neti pot, sinus cleanse)  3. For your sore throat you may try cepacol lozenges, salt water gargles, throat spray. Treatment of congestion may also help your sore throat.  4. For cough you may try Robitussen, Mucinex   5. Take Tylenol or Ibuprofen to help with pain/inflammation  6. Stay hydrated, drink plenty of fluids to keep throat coated and less irritated  Honey Tea For cough/sore throat try using a honey-based tea. Use 3 teaspoons of honey with juice squeezed from half lemon. Place shaved pieces of ginger into 1/2-1 cup of water and warm over stove top. Then mix the ingredients and repeat every 4 hours as needed.   Your rapid strep test was negative.  Throat culture is pending.  COVID-19 viral swab is pending. we will call if any test results are positive.     ED Prescriptions   None    PDMP not reviewed this encounter.   Lance Muss, FNP 04/26/21 1955    Lance Muss, FNP 04/26/21 1956

## 2021-04-26 NOTE — Discharge Instructions (Addendum)
You likely having a viral upper respiratory infection. We recommended symptom control. I expect your symptoms to start improving in the next 1-2 weeks.   1. Take a daily allergy pill/anti-histamine like Zyrtec, Claritin, or Store brand consistently for 2 weeks  2. For congestion you may try an oral decongestant like Mucinex or coricidin HBP. Please do not take any medication that ends in D or DM because it may increase your BP. You may also try intranasal flonase nasal spray or saline irrigations (neti pot, sinus cleanse)  3. For your sore throat you may try cepacol lozenges, salt water gargles, throat spray. Treatment of congestion may also help your sore throat.  4. For cough you may try Robitussen, Mucinex   5. Take Tylenol or Ibuprofen to help with pain/inflammation  6. Stay hydrated, drink plenty of fluids to keep throat coated and less irritated  Honey Tea For cough/sore throat try using a honey-based tea. Use 3 teaspoons of honey with juice squeezed from half lemon. Place shaved pieces of ginger into 1/2-1 cup of water and warm over stove top. Then mix the ingredients and repeat every 4 hours as needed.   Your rapid strep test was negative.  Throat culture is pending.  COVID-19 viral swab is pending. we will call if any test results are positive.

## 2021-04-27 ENCOUNTER — Telehealth: Payer: 59 | Admitting: Nurse Practitioner

## 2021-04-27 DIAGNOSIS — J0101 Acute recurrent maxillary sinusitis: Secondary | ICD-10-CM

## 2021-04-27 LAB — NOVEL CORONAVIRUS, NAA: SARS-CoV-2, NAA: NOT DETECTED

## 2021-04-27 LAB — SARS-COV-2, NAA 2 DAY TAT

## 2021-04-27 MED ORDER — AMOXICILLIN-POT CLAVULANATE 875-125 MG PO TABS: 1.0000 | ORAL_TABLET | Freq: Two times a day (BID) | ORAL | 0 refills | Status: DC

## 2021-04-27 NOTE — Progress Notes (Signed)
Virtual Visit Consent   Vanessa Burton, you are scheduled for a virtual visit with Mary-Margaret Daphine Deutscher, FNP, a [facility] provider, today.     Just as with appointments in the office, your consent must be obtained to participate.  Your consent will be active for this visit and any virtual visit you may have with one of our providers in the next 365 days.     If you have a MyChart account, a copy of this consent can be sent to you electronically.  All virtual visits are billed to your insurance company just like a traditional visit in the office.    As this is a virtual visit, video technology does not allow for your provider to perform a traditional examination.  This may limit your provider's ability to fully assess your condition.  If your provider identifies any concerns that need to be evaluated in person or the need to arrange testing (such as labs, EKG, etc.), we will make arrangements to do so.     Although advances in technology are sophisticated, we cannot ensure that it will always work on either your end or our end.  If the connection with a video visit is poor, the visit may have to be switched to a telephone visit.  With either a video or telephone visit, we are not always able to ensure that we have a secure connection.     I need to obtain your verbal consent now.   Are you willing to proceed with your visit today? YES   Vanessa Burton has provided verbal consent on 04/27/2021 for a virtual visit (video or telephone).   Mary-Margaret Daphine Deutscher, FNP   Date: 04/27/2021 7:24 PM   Virtual Visit via Video Note   I, Mary-Margaret Daphine Deutscher, connected with Vanessa Burton ([id number], [birth year]) on 04/27/21 at  7:30 PM EDT by a video-enabled telemedicine application and verified that I am speaking with the correct person using two identifiers.  Location: Patient: Virtual Visit Location Patient: Home Provider: Virtual Visit Location Provider: Mobile   I discussed the limitations of  evaluation and management by telemedicine and the availability of in person appointments. The patient expressed understanding and agreed to proceed.    History of Present Illness: Vanessa Burton is a 45 y.o. who identifies as a female who was assigned female at birth, and is being seen today for sinusitis.  HPI: HPI  Patient said she went to urgent care yesterday with nasal congestion, b=cough and achiness. She covid test were negative. She still has cough, congestion, sore throat. Nasal congestion with headache. Has some facial pressure.  Review of Systems  Constitutional:  Positive for malaise/fatigue. Negative for chills and fever.  HENT:  Positive for congestion, sinus pain and sore throat.   Respiratory:  Positive for cough and sputum production.   Musculoskeletal:  Negative for myalgias.  Neurological:  Positive for dizziness and headaches.   Problems:  Patient Active Problem List   Diagnosis Date Noted   GERD (gastroesophageal reflux disease) 06/29/2018   Bilateral leg edema 04/01/2018   Obesity 04/01/2018   Vitamin D deficiency 04/01/2018   Anxiety 03/30/2018   Depression 03/30/2018   Essential hypertension 03/30/2018   Irritable bowel syndrome with constipation 03/30/2018   OSA (obstructive sleep apnea) 03/30/2018   Acne 01/06/2013    Allergies: No Known Allergies Medications:  Current Outpatient Medications:    amLODipine (NORVASC) 5 MG tablet, Take 1 tablet (5 mg total) by mouth daily., Disp: 90 tablet, Rfl: 0  hydrochlorothiazide (HYDRODIURIL) 50 MG tablet, Take 1 tablet (50 mg total) by mouth daily., Disp: 90 tablet, Rfl: 0   polyethylene glycol powder (GLYCOLAX/MIRALAX) 17 GM/SCOOP powder, Take 17 g by mouth daily., Disp: 3350 g, Rfl: 0   triamcinolone cream (KENALOG) 0.1 %, Apply topically 2 (two) times daily as needed., Disp: 30 g, Rfl: 6  Observations/Objective: Patient is well-developed, well-nourished in no acute distress.  Resting comfortably  at home.   Head is normocephalic, atraumatic.  No labored breathing.  Speech is clear and coherent with logical content.  Patient is alert and oriented at baseline.  Voice hoarse Slight cough noted  Assessment and Plan: Vanessa Burton in today with chief complaint of No chief complaint on file.   1. Acute recurrent maxillary sinusitis 1. Take meds as prescribed 2. Use a cool mist humidifier especially during the winter months and when heat has been humid. 3. Use saline nose sprays frequently 4. Saline irrigations of the nose can be very helpful if done frequently.  * 4X daily for 1 week*  * Use of a nettie pot can be helpful with this. Follow directions with this* 5. Drink plenty of fluids 6. Keep thermostat turn down low 7.For any cough or congestion  Use plain Mucinex- regular strength or max strength is fine   * Children- consult with Pharmacist for dosing 8. For fever or aces or pains- take tylenol or ibuprofen appropriate for age and weight.  * for fevers greater than 101 orally you may alternate ibuprofen and tylenol every  3 hours.   Meds ordered this encounter  Medications   amoxicillin-clavulanate (AUGMENTIN) 875-125 MG tablet    Sig: Take 1 tablet by mouth 2 (two) times daily.    Dispense:  14 tablet    Refill:  0    Order Specific Question:   Supervising Provider    Answer:   Eber Hong [[id number]]          Follow Up Instructions: I discussed the assessment and treatment plan with the patient. The patient was provided an opportunity to ask questions and all were answered. The patient agreed with the plan and demonstrated an understanding of the instructions.  A copy of instructions were sent to the patient via MyChart.  The patient was advised to call back or seek an in-person evaluation if the symptoms worsen or if the condition fails to improve as anticipated.  Time:  I spent 12 minutes with the patient via telehealth technology discussing the above  problems/concerns.    Mary-Margaret Daphine Deutscher, FNP

## 2021-04-28 ENCOUNTER — Other Ambulatory Visit: Payer: Self-pay | Admitting: Family

## 2021-04-28 DIAGNOSIS — I10 Essential (primary) hypertension: Secondary | ICD-10-CM

## 2021-04-30 LAB — CULTURE, GROUP A STREP (THRC)

## 2021-06-09 DIAGNOSIS — G4733 Obstructive sleep apnea (adult) (pediatric): Secondary | ICD-10-CM | POA: Diagnosis not present

## 2021-06-09 DIAGNOSIS — I10 Essential (primary) hypertension: Secondary | ICD-10-CM | POA: Diagnosis not present

## 2021-06-13 ENCOUNTER — Other Ambulatory Visit: Payer: Self-pay

## 2021-06-13 ENCOUNTER — Encounter: Payer: Self-pay | Admitting: Emergency Medicine

## 2021-06-13 ENCOUNTER — Ambulatory Visit
Admission: EM | Admit: 2021-06-13 | Discharge: 2021-06-13 | Disposition: A | Discharge status: Home / Self Care | Payer: 59 | Attending: Emergency Medicine | Admitting: Emergency Medicine

## 2021-06-13 DIAGNOSIS — N760 Acute vaginitis: Secondary | ICD-10-CM | POA: Insufficient documentation

## 2021-06-13 DIAGNOSIS — Z113 Encounter for screening for infections with a predominantly sexual mode of transmission: Secondary | ICD-10-CM | POA: Diagnosis present

## 2021-06-13 DIAGNOSIS — R69 Illness, unspecified: Secondary | ICD-10-CM | POA: Diagnosis not present

## 2021-06-13 DIAGNOSIS — N898 Other specified noninflammatory disorders of vagina: Secondary | ICD-10-CM | POA: Insufficient documentation

## 2021-06-13 LAB — POCT URINE PREGNANCY: Preg Test, Ur: NEGATIVE

## 2021-06-13 LAB — POCT URINALYSIS DIP (MANUAL ENTRY)
Bilirubin, UA: NEGATIVE
Blood, UA: NEGATIVE
Glucose, UA: NEGATIVE mg/dL
Nitrite, UA: NEGATIVE
Protein Ur, POC: NEGATIVE mg/dL
Spec Grav, UA: >=1.03 — AB (ref 1.010–1.025)
Urobilinogen, UA: 0.2 E.U./dL
pH, UA: 5.5 (ref 5.0–8.0)

## 2021-06-13 MED ORDER — FLUCONAZOLE 150 MG PO TABS: 150.0000 mg | ORAL_TABLET | Freq: Once | ORAL | 0 refills | Status: AC

## 2021-06-13 MED ORDER — CLOTRIMAZOLE-BETAMETHASONE 1-0.05 % EX CREA: TOPICAL_CREAM | CUTANEOUS | 0 refills | Status: DC

## 2021-06-13 MED ORDER — METRONIDAZOLE 500 MG PO TABS: 500.0000 mg | ORAL_TABLET | Freq: Two times a day (BID) | ORAL | 0 refills | Status: AC

## 2021-06-13 NOTE — Discharge Instructions (Addendum)
Take 1 tab of Diflucan today, repeat after completing course of Flagyl.  Begin metronidazole/Flagyl twice daily for 1 week to cover trichomonas and BV.  No alcohol until 24 hours after last tablet.  We are testing you for HIV, Syphillis, Gonorrhea, Chlamydia, Trichomonas, Yeast and Bacterial Vaginosis. We will call you if anything is positive and let you know if you require any further treatment. Please inform partners of any positive results.   Please return if symptoms not improving with treatment, development of fever, nausea, vomiting, abdominal pain.

## 2021-06-13 NOTE — ED Triage Notes (Signed)
Patient presents to [facility] for evaluation of vaginal discharge x 3 days.  Hx of BV, but states this does not feel the same.  Recent new sexual partner.  C/o burning with urination.  Denies abdominal pain.

## 2021-06-14 LAB — CERVICOVAGINAL ANCILLARY ONLY
Bacterial Vaginitis (gardnerella): NEGATIVE
Candida Glabrata: NEGATIVE
Candida Vaginitis: NEGATIVE
Chlamydia: NEGATIVE
Comment: NEGATIVE
Comment: NEGATIVE
Comment: NEGATIVE
Comment: NEGATIVE
Comment: NEGATIVE
Comment: NORMAL
Neisseria Gonorrhea: NEGATIVE
Trichomonas: NEGATIVE

## 2021-06-14 LAB — RPR: RPR Ser Ql: NONREACTIVE

## 2021-06-14 LAB — HIV ANTIBODY (ROUTINE TESTING W REFLEX): HIV Screen 4th Generation wRfx: NONREACTIVE

## 2021-06-14 NOTE — ED Provider Notes (Signed)
[facility]    [name]: [id number] Arrival date & time: 06/13/21  1227      History   Chief Complaint Chief Complaint  Patient presents with   Vaginal Discharge    HPI Vanessa Burton is a 45 y.o. female presenting today for evaluation of vaginal discharge.  Reports that she has had discharge for the past 3 days.  Reports history of prior bacterial vaginosis, but does not feel similar.  Has had associated dysuria.  Denies associate abdominal pain.  Has felt irritation.  Does express concern over possible STD as she has had any sexual partner.  HPI  Past Medical History:  Diagnosis Date   Hypertension    Irritable bowel syndrome (IBS)    OSA (obstructive sleep apnea)    Vitamin D deficiency     Patient Active Problem List   Diagnosis Date Noted   GERD (gastroesophageal reflux disease) 06/29/2018   Bilateral leg edema 04/01/2018   Obesity 04/01/2018   Vitamin D deficiency 04/01/2018   Anxiety 03/30/2018   Depression 03/30/2018   Essential hypertension 03/30/2018   Irritable bowel syndrome with constipation 03/30/2018   OSA (obstructive sleep apnea) 03/30/2018   Acne 01/06/2013    Past Surgical History:  Procedure Laterality Date   TUBAL LIGATION      OB History   No obstetric history on file.      Home Medications    Prior to Admission medications   Medication Sig Start Date End Date Taking? Authorizing Provider  metroNIDAZOLE (FLAGYL) 500 MG tablet Take 1 tablet (500 mg total) by mouth 2 (two) times daily for 7 days. 06/13/21 06/20/21 Yes Harlee Pursifull C, PA-C  amLODipine (NORVASC) 5 MG tablet TAKE 1 TABLET (5 MG TOTAL) BY MOUTH DAILY. 04/29/21   Rema Fendt, NP  clotrimazole-betamethasone (LOTRISONE) cream Apply to affected area 2 times daily as needed 06/13/21   Paulino Cork C, PA-C  hydrochlorothiazide (HYDRODIURIL) 50 MG tablet TAKE 1 TABLET BY MOUTH EVERY DAY 04/29/21   Rema Fendt, NP  polyethylene glycol powder (GLYCOLAX/MIRALAX) 17  GM/SCOOP powder Take 17 g by mouth daily. 03/20/21   Rema Fendt, NP  triamcinolone cream (KENALOG) 0.1 % Apply topically 2 (two) times daily as needed. 01/09/20   Cain Saupe, MD    Family History Family History  Problem Relation Age of Onset   Hypertension Mother    Heart attack Mother    Kidney disease Father    Diabetes Father     Social History Social History   Tobacco Use   Smoking status: Never   Smokeless tobacco: Never  Vaping Use   Vaping Use: Never used  Substance Use Topics   Alcohol use: Never   Drug use: Never     Allergies   Patient has no known allergies.   Review of Systems Review of Systems  Constitutional:  Negative for fever.  Respiratory:  Negative for shortness of breath.   Cardiovascular:  Negative for chest pain.  Gastrointestinal:  Negative for abdominal pain, diarrhea, nausea and vomiting.  Genitourinary:  Positive for dysuria and vaginal discharge. Negative for flank pain, genital sores, hematuria, menstrual problem, vaginal bleeding and vaginal pain.  Musculoskeletal:  Negative for back pain.  Skin:  Negative for rash.  Neurological:  Negative for dizziness, light-headedness and headaches.    Physical Exam Triage Vital Signs ED Triage Vitals [06/13/21 1251]  Enc Vitals Group     BP (!) 128/52     Pulse Rate 87  Resp 18     Temp 98.9 F (37.2 C)     Temp Source Oral     SpO2 98 %     Weight      Height      Head Circumference      Peak Flow      Pain Score 0     Pain Loc      Pain Edu?      Excl. in GC?    No data found.  Updated Vital Signs BP (!) 128/52 (BP Location: Right Arm)   Pulse 87   Temp 98.9 F (37.2 C) (Oral)   Resp 18   SpO2 98%   Visual Acuity Right Eye Distance:   Left Eye Distance:   Bilateral Distance:    Right Eye Near:   Left Eye Near:    Bilateral Near:     Physical Exam Vitals and nursing note reviewed.  Constitutional:      Appearance: She is well-developed.     Comments: No  acute distress  HENT:     Head: Normocephalic and atraumatic.     Nose: Nose normal.  Eyes:     Conjunctiva/sclera: Conjunctivae normal.  Cardiovascular:     Rate and Rhythm: Normal rate.  Pulmonary:     Effort: Pulmonary effort is normal. No respiratory distress.  Abdominal:     General: There is no distension.  Musculoskeletal:        General: Normal range of motion.     Cervical back: Neck supple.  Skin:    General: Skin is warm and dry.  Neurological:     Mental Status: She is alert and oriented to person, place, and time.     UC Treatments / Results  Labs (all labs ordered are listed, but only abnormal results are displayed) Labs Reviewed  POCT URINALYSIS DIP (MANUAL ENTRY) - Abnormal; Notable for the following components:      Result Value   Ketones, POC UA trace (5) (*)    Spec Grav, UA >=1.030 (*)    Leukocytes, UA Trace (*)    All other components within normal limits  HIV ANTIBODY (ROUTINE TESTING W REFLEX)   Narrative:    Performed at:  [facility] [address] [place], [place]  [id number] Lab Director: Jolene Schimke MD, Phone:  [phone]  RPR   Narrative:    Performed at:  [address] [place] [address] [place], [place]  [id number] Lab Director: Jolene Schimke MD, Phone:  [phone]  POCT URINE PREGNANCY  CERVICOVAGINAL ANCILLARY ONLY    EKG   Radiology No results found.  Procedures Procedures (including critical care time)  Medications Ordered in UC Medications - No data to display  Initial Impression / Assessment and Plan / UC Course  I have reviewed the triage vital signs and the nursing notes.  Pertinent labs & imaging results that were available during my care of the patient were reviewed by me and considered in my medical decision making (see chart for details).     Vaginal discharge/screen for STDs-empirically treating for yeast/BV today with Diflucan and Flagyl.  Swab pending to further confirm cause of  discharge as well as checking blood work for HIV and syphilis screening.  We will call with results and provide further treatment as needed.  Discussed strict return precautions. Patient verbalized understanding and is agreeable with plan.  Final Clinical Impressions(s) / UC Diagnoses   Final diagnoses:  Screen for STD (sexually transmitted disease)  Vaginal discharge  Vaginitis and vulvovaginitis     Discharge Instructions      Take 1 tab of Diflucan today, repeat after completing course of Flagyl.  Begin metronidazole/Flagyl twice daily for 1 week to cover trichomonas and BV.  No alcohol until 24 hours after last tablet.  We are testing you for HIV, Syphillis, Gonorrhea, Chlamydia, Trichomonas, Yeast and Bacterial Vaginosis. We will call you if anything is positive and let you know if you require any further treatment. Please inform partners of any positive results.   Please return if symptoms not improving with treatment, development of fever, nausea, vomiting, abdominal pain.      ED Prescriptions     Medication Sig Dispense Auth. Provider   metroNIDAZOLE (FLAGYL) 500 MG tablet Take 1 tablet (500 mg total) by mouth 2 (two) times daily for 7 days. 14 tablet Rayhana Slider C, PA-C   fluconazole (DIFLUCAN) 150 MG tablet Take 1 tablet (150 mg total) by mouth once for 1 dose. 2 tablet Annalisse Minkoff C, PA-C   clotrimazole-betamethasone (LOTRISONE) cream  (Status: Discontinued) Apply to affected area 2 times daily as needed 15 g Marieann Zipp C, PA-C   clotrimazole-betamethasone (LOTRISONE) cream Apply to affected area 2 times daily as needed 45 g Nole Robey, [place] C, PA-C      PDMP not reviewed this encounter.   Lew Dawes, [place] 06/14/21 [phone]

## 2021-06-20 ENCOUNTER — Ambulatory Visit (HOSPITAL_COMMUNITY): Admission: RE | Admit: 2021-06-20 | Payer: 59 | Source: Ambulatory Visit

## 2021-07-03 IMAGING — CR DG CHEST 2V
1 series · 1 of 1 positions shown · non-contrast
Comparison: None.

CLINICAL DATA: Headache, cough and nausea x1 day.

EXAM:
CHEST - 2 VIEW

[chest lat]
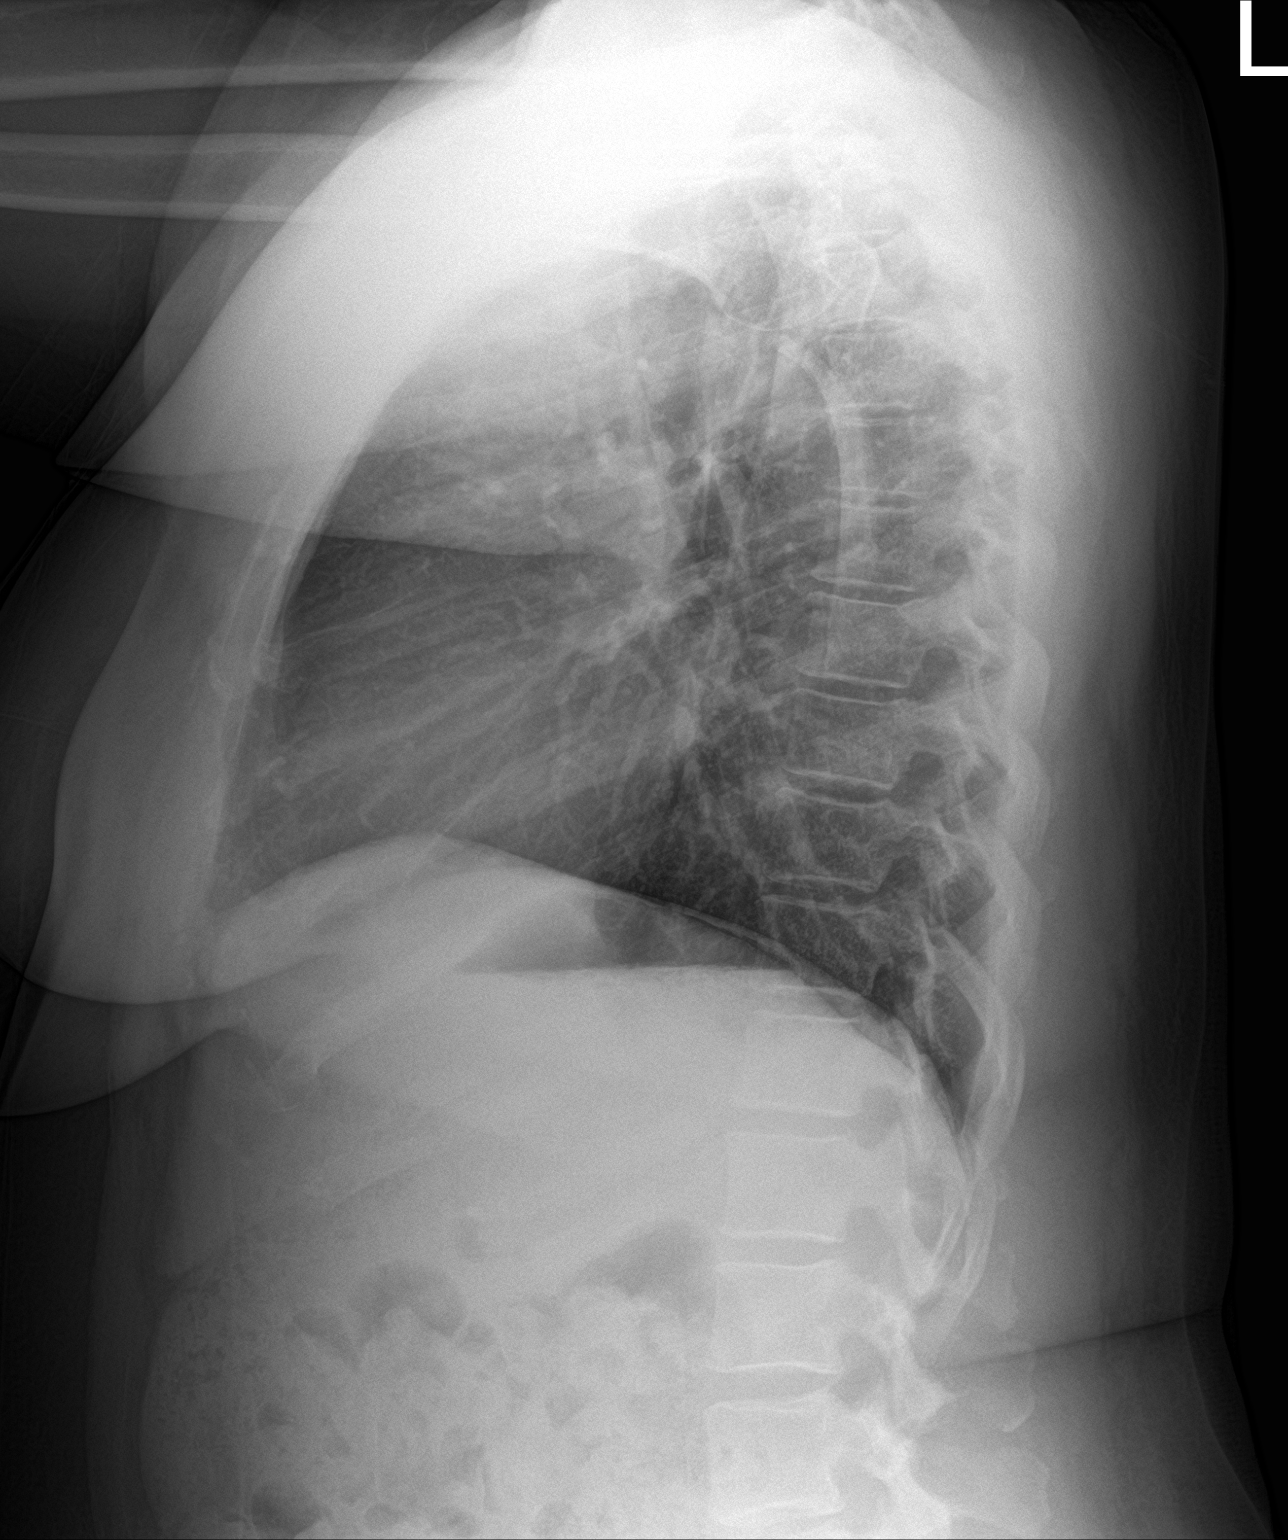

[1 of 1 positions shown; findings below may reference images not displayed]

FINDINGS: Mildly decreased lung volumes are seen which is likely secondary to
the degree of patient inspiration. There is no evidence of acute
infiltrate, pleural effusion or pneumothorax. The heart size and
mediastinal contours are within normal limits. The visualized
skeletal structures are unremarkable.
IMPRESSION: No active cardiopulmonary disease.

## 2021-07-07 NOTE — Progress Notes (Signed)
Erroneous encounter

## 2021-07-12 ENCOUNTER — Encounter: Payer: 59 | Admitting: Family

## 2021-07-12 DIAGNOSIS — Z0289 Encounter for other administrative examinations: Secondary | ICD-10-CM

## 2021-07-12 NOTE — Progress Notes (Signed)
Patient ID: Vanessa Burton, female    DOB: 30-Jan-1976  MRN: [id number]  CC: Medical Accommodation Paperwork  Subjective: Vanessa Burton is a 45 y.o. female who presents for [facility] paperwork.   Her concerns today include:  Patient presents with medical accommodation paperwork from [employer] requesting completion from provider. Reports anxiety causes decreased concentration/focusing at work. Prefers to work in her current role from home on a permanent basis to begin July 22, 2021. She is ready to begin anxiety medication.   Depression screen [facility] 2/9 07/15/2021 03/20/2021 01/04/2021 12/19/2020 01/09/2020  Decreased Interest 2 1 2  0 0  Down, Depressed, Hopeless 1 1 2  0 0  PHQ - 2 Score 3 2 4  0 0  Altered sleeping 3 1 2  - 1  Tired, decreased energy 3 1 2  - 3  Change in appetite 1 1 3  - 0  Feeling bad or failure about yourself  0 1 0 - 0  Trouble concentrating 3 1 1  - 0  Moving slowly or fidgety/restless 1 0 0 - 0  Suicidal thoughts 0 0 0 - 0  PHQ-9 Score 14 7 12  - 4  Difficult doing work/chores Somewhat difficult Not difficult at all Somewhat difficult - Not difficult at all     Patient Active Problem List   Diagnosis Date Noted   GERD (gastroesophageal reflux disease) 06/29/2018   Bilateral leg edema 04/01/2018   Obesity 04/01/2018   Vitamin D deficiency 04/01/2018   Anxiety 03/30/2018   Depression 03/30/2018   Essential hypertension 03/30/2018   Irritable bowel syndrome with constipation 03/30/2018   OSA (obstructive sleep apnea) 03/30/2018   Lower urinary tract symptoms (LUTS) 01/08/2017   Acne 01/06/2013     Current Outpatient Medications on File Prior to Visit  Medication Sig Dispense Refill   amLODipine (NORVASC) 5 MG tablet TAKE 1 TABLET (5 MG TOTAL) BY MOUTH DAILY. 90 tablet 0   clotrimazole-betamethasone (LOTRISONE) cream Apply to affected area 2 times daily as needed 45 g 0   hydrochlorothiazide (HYDRODIURIL) 50 MG tablet TAKE 1 TABLET BY MOUTH EVERY DAY 90 tablet 0    polyethylene glycol powder (GLYCOLAX/MIRALAX) 17 GM/SCOOP powder Take 17 g by mouth daily. 3350 g 0   triamcinolone cream (KENALOG) 0.1 % Apply topically 2 (two) times daily as needed. 30 g 6   No current facility-administered medications on file prior to visit.    No Known Allergies  Social History   Socioeconomic History   Marital status: Single    Spouse name: Not on file   Number of children: 2   Years of education: Not on file   Highest education level: Not on file  Occupational History   Occupation: school bus driver  Tobacco Use   Smoking status: Never   Smokeless tobacco: Never  Vaping Use   Vaping Use: Never used  Substance and Sexual Activity   Alcohol use: Never   Drug use: Never   Sexual activity: Not on file  Other Topics Concern   Not on file  Social History Narrative   Not on file   Social Determinants of Health   Financial Resource Strain: Not on file  Food Insecurity: Not on file  Transportation Needs: Not on file  Physical Activity: Not on file  Stress: Not on file  Social Connections: Not on file  Intimate Partner Violence: Not on file    Family History  Problem Relation Age of Onset   Hypertension Mother    Heart attack Mother  Kidney disease Father    Diabetes Father     Past Surgical History:  Procedure Laterality Date   TUBAL LIGATION      ROS: Review of Systems Negative except as stated above  PHYSICAL EXAM: BP (!) 133/91 (BP Location: Left Arm, Patient Position: Sitting, Cuff Size: Large)   Pulse 73   Temp 98.1 F (36.7 C)   Resp 16   Ht 5' 6.81" (1.697 m)   Wt 197 lb (89.4 kg)   SpO2 99%   BMI 31.03 kg/m   Physical Exam HENT:     Head: Normocephalic and atraumatic.  Eyes:     Extraocular Movements: Extraocular movements intact.     Conjunctiva/sclera: Conjunctivae normal.     Pupils: Pupils are equal, round, and reactive to light.  Cardiovascular:     Rate and Rhythm: Normal rate and regular rhythm.      Pulses: Normal pulses.     Heart sounds: Normal heart sounds.  Pulmonary:     Effort: Pulmonary effort is normal.     Breath sounds: Normal breath sounds.  Musculoskeletal:     Cervical back: Normal range of motion and neck supple.  Neurological:     General: No focal deficit present.     Mental Status: She is alert and oriented to person, place, and time.  Psychiatric:        Mood and Affect: Mood normal.        Behavior: Behavior normal.    ASSESSMENT AND PLAN: 1. Anxiety and depression: - Patient denies thoughts of self-harm, suicidal ideations, and homicidal ideations.  - Begin Hydroxyzine as prescribed. Discussed medication compliance and adverse effects. May cause drowsiness. Counseled patient to not consume if operating heavy machinery or driving. Counseled patient to not consume with alcohol or illicit substances. Patient verbalized understanding.  - Follow-up with primary provider in 4 weeks or sooner if needed.  - hydrOXYzine (VISTARIL) 25 MG capsule; Take 1 capsule (25 mg total) by mouth every 8 (eight) hours as needed.  Dispense: 30 capsule; Refill: 0  2. Encounter for completion of form with patient: - Completed medical accommodation form today with patient in office.    Patient was given the opportunity to ask questions.  Patient verbalized understanding of the plan and was able to repeat key elements of the plan. Patient was given clear instructions to go to Emergency Department or return to medical center if symptoms don't improve, worsen, or new problems develop.The patient verbalized understanding.    Requested Prescriptions   Signed Prescriptions Disp Refills   hydrOXYzine (VISTARIL) 25 MG capsule 30 capsule 0    Sig: Take 1 capsule (25 mg total) by mouth every 8 (eight) hours as needed.    Return in about 4 weeks (around 08/12/2021) for Follow-Up or next available anxiety .  Rema Fendt, NP

## 2021-07-15 ENCOUNTER — Ambulatory Visit (INDEPENDENT_AMBULATORY_CARE_PROVIDER_SITE_OTHER): Payer: 59 | Admitting: Family

## 2021-07-15 ENCOUNTER — Encounter: Payer: Self-pay | Admitting: Family

## 2021-07-15 ENCOUNTER — Other Ambulatory Visit: Payer: Self-pay

## 2021-07-15 VITALS — BP 133/91 | HR 73 | Temp 98.1°F | Resp 16 | Ht 66.81 in | Wt 197.0 lb

## 2021-07-15 DIAGNOSIS — F32A Depression, unspecified: Secondary | ICD-10-CM | POA: Diagnosis not present

## 2021-07-15 DIAGNOSIS — R69 Illness, unspecified: Secondary | ICD-10-CM | POA: Diagnosis not present

## 2021-07-15 DIAGNOSIS — F419 Anxiety disorder, unspecified: Secondary | ICD-10-CM | POA: Diagnosis not present

## 2021-07-15 DIAGNOSIS — Z0289 Encounter for other administrative examinations: Secondary | ICD-10-CM | POA: Diagnosis not present

## 2021-07-15 MED ORDER — HYDROXYZINE PAMOATE 25 MG PO CAPS
25.0000 mg | ORAL_CAPSULE | Freq: Three times a day (TID) | ORAL | 0 refills | Status: DC | PRN
Start: 2021-07-15 — End: 2021-10-31

## 2021-07-15 NOTE — Progress Notes (Signed)
Pt presents for anxiety, needs paperwork completed for medical accomodation for employer to work from home due to workplace distractions and difficulty concentrating when in office pt reports anxiety increases when in office Desires flu vaccine

## 2021-07-15 NOTE — Patient Instructions (Signed)
Hydroxyzine Capsules or Tablets What is this medication? HYDROXYZINE (hye DROX i zeen) treats the symptoms of allergies and allergic reactions. It may also be used to treat anxiety or cause drowsiness before a procedure. It works by blocking histamine, a substance released by the body during an allergic reaction. It belongs to a group of medications called antihistamines. This medicine may be used for other purposes; ask your health care provider or pharmacist if you have questions. COMMON BRAND NAME(S): ANX, Atarax, Rezine, Vistaril What should I tell my care team before I take this medication? They need to know if you have any of these conditions: Glaucoma Heart disease History of irregular heartbeat Kidney disease Liver disease Lung or breathing disease, like asthma Stomach or intestine problems Thyroid disease Trouble passing urine An unusual or allergic reaction to hydroxyzine, cetirizine, other medications, foods, dyes or preservatives Pregnant or trying to get pregnant Breast-feeding How should I use this medication? Take this medication by mouth with a full glass of water. Follow the directions on the prescription label. You may take this medication with food or on an empty stomach. Take your medication at regular intervals. Do not take your medication more often than directed. Talk to your care team regarding the use of this medication in children. Special care may be needed. While this medication may be prescribed for children as young as 6 years of age for selected conditions, precautions do apply. Patients over 65 years old may have a stronger reaction and need a smaller dose. Overdosage: If you think you have taken too much of this medicine contact a poison control center or emergency room at once. NOTE: This medicine is only for you. Do not share this medicine with others. What if I miss a dose? If you miss a dose, take it as soon as you can. If it is almost time for your next  dose, take only that dose. Do not take double or extra doses. What may interact with this medication? Do not take this medication with any of the following: Cisapride Dronedarone Pimozide Thioridazine This medication may also interact with the following: Alcohol Antihistamines for allergy, cough, and cold Atropine Barbiturate medications for sleep or seizures, like phenobarbital Certain antibiotics like erythromycin or clarithromycin Certain medications for anxiety or sleep Certain medications for bladder problems like oxybutynin, tolterodine Certain medications for depression or psychotic disturbances Certain medications for irregular heart beat Certain medications for Parkinson's disease like benztropine, trihexyphenidyl Certain medications for seizures like phenobarbital, primidone Certain medications for stomach problems like dicyclomine, hyoscyamine Certain medications for travel sickness like scopolamine Ipratropium Narcotic medications for pain Other medications that prolong the QT interval (which can cause an abnormal heart rhythm) like dofetilide This list may not describe all possible interactions. Give your health care provider a list of all the medicines, herbs, non-prescription drugs, or dietary supplements you use. Also tell them if you smoke, drink alcohol, or use illegal drugs. Some items may interact with your medicine. What should I watch for while using this medication? Tell your care team if your symptoms do not improve. You may get drowsy or dizzy. Do not drive, use machinery, or do anything that needs mental alertness until you know how this medication affects you. Do not stand or sit up quickly, especially if you are an older patient. This reduces the risk of dizzy or fainting spells. Alcohol may interfere with the effect of this medication. Avoid alcoholic drinks. Your mouth may get dry. Chewing sugarless gum or sucking hard   candy, and drinking plenty of water may  help. Contact your care team if the problem does not go away or is severe. This medication may cause dry eyes and blurred vision. If you wear contact lenses you may feel some discomfort. Lubricating drops may help. See your eye care specialist if the problem does not go away or is severe. If you are receiving skin tests for allergies, tell your care team you are using this medication. What side effects may I notice from receiving this medication? Side effects that you should report to your care team as soon as possible: Allergic reactions-skin rash, itching, hives, swelling of the face, lips, tongue, or throat Heart rhythm changes-fast or irregular heartbeat, dizziness, feeling faint or lightheaded, chest pain, trouble breathing Side effects that usually do not require medical attention (report to your care team if they continue or are bothersome): Confusion Drowsiness Dry mouth Hallucinations Headache This list may not describe all possible side effects. Call your doctor for medical advice about side effects. You may report side effects to FDA at 1-800-FDA-1088. Where should I keep my medication? Keep out of the reach of children and pets. Store at room temperature between 15 and 30 degrees C (59 and 86 degrees F). Keep container tightly closed. Throw away any unused medication after the expiration date. NOTE: This sheet is a summary. It may not cover all possible information. If you have questions about this medicine, talk to your doctor, pharmacist, or health care provider.  2022 Elsevier/Gold Standard (2020-12-18 15:19:25)  

## 2021-08-03 ENCOUNTER — Other Ambulatory Visit: Payer: Self-pay | Admitting: Family

## 2021-08-03 DIAGNOSIS — I10 Essential (primary) hypertension: Secondary | ICD-10-CM

## 2021-08-28 ENCOUNTER — Telehealth: Payer: 59 | Admitting: Family Medicine

## 2021-08-28 DIAGNOSIS — B3731 Acute candidiasis of vulva and vagina: Secondary | ICD-10-CM | POA: Diagnosis not present

## 2021-08-28 MED ORDER — FLUCONAZOLE 150 MG PO TABS: 150.0000 mg | ORAL_TABLET | Freq: Every day | ORAL | 0 refills | Status: DC

## 2021-08-28 MED ORDER — HYDROCORTISONE 1 % EX CREA: 1.0000 "application " | TOPICAL_CREAM | Freq: Two times a day (BID) | CUTANEOUS | 0 refills | Status: DC

## 2021-08-28 MED ORDER — FLUCONAZOLE 150 MG PO TABS: 150.0000 mg | ORAL_TABLET | Freq: Once | ORAL | 0 refills | Status: AC

## 2021-08-28 NOTE — Progress Notes (Signed)

## 2021-08-28 NOTE — Addendum Note (Signed)
Addended by: Freddy Finner on: 08/28/2021 03:14 PM   Modules accepted: Orders

## 2021-09-19 DIAGNOSIS — Z01419 Encounter for gynecological examination (general) (routine) without abnormal findings: Secondary | ICD-10-CM | POA: Diagnosis not present

## 2021-09-19 DIAGNOSIS — Z113 Encounter for screening for infections with a predominantly sexual mode of transmission: Secondary | ICD-10-CM | POA: Diagnosis not present

## 2021-09-19 DIAGNOSIS — Z86010 Personal history of colonic polyps: Secondary | ICD-10-CM | POA: Diagnosis not present

## 2021-09-19 DIAGNOSIS — Z1231 Encounter for screening mammogram for malignant neoplasm of breast: Secondary | ICD-10-CM | POA: Diagnosis not present

## 2021-09-19 DIAGNOSIS — R69 Illness, unspecified: Secondary | ICD-10-CM | POA: Diagnosis not present

## 2021-09-19 DIAGNOSIS — Z6832 Body mass index (BMI) 32.0-32.9, adult: Secondary | ICD-10-CM | POA: Diagnosis not present

## 2021-09-19 DIAGNOSIS — Z803 Family history of malignant neoplasm of breast: Secondary | ICD-10-CM | POA: Diagnosis not present

## 2021-09-25 ENCOUNTER — Other Ambulatory Visit: Payer: Self-pay | Admitting: Family

## 2021-09-25 DIAGNOSIS — I10 Essential (primary) hypertension: Secondary | ICD-10-CM

## 2021-10-12 ENCOUNTER — Other Ambulatory Visit: Payer: Self-pay | Admitting: Nurse Practitioner

## 2021-10-12 ENCOUNTER — Telehealth: Payer: 59 | Admitting: Nurse Practitioner

## 2021-10-12 ENCOUNTER — Encounter: Payer: Self-pay | Admitting: Nurse Practitioner

## 2021-10-12 DIAGNOSIS — B3731 Acute candidiasis of vulva and vagina: Secondary | ICD-10-CM

## 2021-10-12 DIAGNOSIS — J020 Streptococcal pharyngitis: Secondary | ICD-10-CM | POA: Diagnosis not present

## 2021-10-12 MED ORDER — FLUCONAZOLE 150 MG PO TABS: 150.0000 mg | ORAL_TABLET | Freq: Every day | ORAL | 0 refills | Status: DC

## 2021-10-12 MED ORDER — AMOXICILLIN 500 MG PO CAPS: 500.0000 mg | ORAL_CAPSULE | Freq: Two times a day (BID) | ORAL | 0 refills | Status: DC

## 2021-10-12 MED ORDER — FLUCONAZOLE 150 MG PO TABS: 150.0000 mg | ORAL_TABLET | Freq: Every day | ORAL | 1 refills | Status: DC

## 2021-10-12 NOTE — Progress Notes (Signed)
I have spent 5 minutes in review of e-visit questionnaire, review and updating patient chart, medical decision making and response to patient.  ° °Alice Vitelli W Jimmie Dattilio, NP ° °  °

## 2021-10-12 NOTE — Progress Notes (Signed)
E-Visit for Sore Throat - Strep Symptoms  We are sorry that you are not feeling well.  Here is how we plan to help!  Based on what you have shared with me it is likely that you have strep pharyngitis.  Strep pharyngitis is inflammation and infection in the back of the throat.  This is an infection cause by bacteria and is treated with antibiotics.  I have prescribed Amoxicillin 500 mg twice a day for 10 days. I have sent one diflucan. We do not usually prescribe more than 1 through virtual visits. For throat pain, we recommend over the counter oral pain relief medications such as acetaminophen or aspirin, or anti-inflammatory medications such as ibuprofen or naproxen sodium. Topical treatments such as oral throat lozenges or sprays may be used as needed. Strep infections are not as easily transmitted as other respiratory infections, however we still recommend that you avoid close contact with loved ones, especially the very young and elderly.  Remember to wash your hands thoroughly throughout the day as this is the number one way to prevent the spread of infection and wipe down door knobs and counters with disinfectant.   Home Care: Only take medications as instructed by your medical team. Complete the entire course of an antibiotic. Do not take these medications with alcohol. A steam or ultrasonic humidifier can help congestion.  You can place a towel over your head and breathe in the steam from hot water coming from a faucet. Avoid close contacts especially the very young and the elderly. Cover your mouth when you cough or sneeze. Always remember to wash your hands.  Get Help Right Away If: You develop worsening fever or sinus pain. You develop a severe head ache or visual changes. Your symptoms persist after you have completed your treatment plan.  Make sure you Understand these instructions. Will watch your condition. Will get help right away if you are not doing well or get  worse.   Thank you for choosing an e-visit.  Your e-visit answers were reviewed by a board certified advanced clinical practitioner to complete your personal care plan. Depending upon the condition, your plan could have included both over the counter or prescription medications.  Please review your pharmacy choice. Make sure the pharmacy is open so you can pick up prescription now. If there is a problem, you may contact your provider through [employer] and have the prescription routed to another pharmacy.  Your safety is important to [place]. If you have drug allergies check your prescription carefully.   For the next 24 hours you can use MyChart to ask questions about today's visit, request a non-urgent call back, or ask for a work or school excuse. You will get an email in the next two days asking about your experience. I hope that your e-visit has been valuable and will speed your recovery.

## 2021-10-16 ENCOUNTER — Other Ambulatory Visit: Payer: Self-pay

## 2021-10-16 ENCOUNTER — Ambulatory Visit
Admission: EM | Admit: 2021-10-16 | Discharge: 2021-10-16 | Disposition: A | Discharge status: Home / Self Care | Payer: 59 | Attending: Emergency Medicine | Admitting: Emergency Medicine

## 2021-10-16 DIAGNOSIS — B2790 Infectious mononucleosis, unspecified without complication: Secondary | ICD-10-CM | POA: Diagnosis not present

## 2021-10-16 DIAGNOSIS — J029 Acute pharyngitis, unspecified: Secondary | ICD-10-CM | POA: Insufficient documentation

## 2021-10-16 LAB — POCT RAPID STREP A (OFFICE): Rapid Strep A Screen: NEGATIVE

## 2021-10-16 LAB — POCT MONO SCREEN (KUC): Mono, POC: POSITIVE — AB

## 2021-10-16 MED ORDER — LIDOCAINE VISCOUS HCL 2 % MT SOLN
15.0000 mL | Freq: Once | OROMUCOSAL | Status: AC
  Administered 2021-10-16: 16:00:00 15 mL via OROMUCOSAL

## 2021-10-16 MED ORDER — ACETAMINOPHEN 500 MG PO TABS: 1000.0000 mg | ORAL_TABLET | Freq: Three times a day (TID) | ORAL | 0 refills | Status: AC | PRN

## 2021-10-16 MED ORDER — LIDOCAINE VISCOUS HCL 2 % MT SOLN: 15.0000 mL | OROMUCOSAL | 0 refills | Status: DC | PRN

## 2021-10-16 MED ORDER — IBUPROFEN 800 MG PO TABS
800.0000 mg | ORAL_TABLET | Freq: Once | ORAL | Status: AC
Start: 2021-10-16 — End: 2021-10-16
  Administered 2021-10-16: 16:00:00 800 mg via ORAL

## 2021-10-16 MED ORDER — IBUPROFEN 600 MG PO TABS: 600.0000 mg | ORAL_TABLET | Freq: Three times a day (TID) | ORAL | 0 refills | Status: AC | PRN

## 2021-10-16 NOTE — ED Triage Notes (Signed)
Pt reports having sore throat and has pain that radiates to her right ear.  Started: 6 days ago

## 2021-10-16 NOTE — ED Provider Notes (Signed)
[facility]    [name]: [id number] Arrival date & time: 10/16/21  1345    HISTORY  No chief complaint on file.  HPI Vanessa Burton is a 45 y.o. female. Patient reports 6-day history of sore throat and pain that radiates to her right ear.  The history is provided by the patient.  Past Medical History:  Diagnosis Date   Hypertension    Irritable bowel syndrome (IBS)    OSA (obstructive sleep apnea)    Vitamin D deficiency    Patient Active Problem List   Diagnosis Date Noted   GERD (gastroesophageal reflux disease) 06/29/2018   Bilateral leg edema 04/01/2018   Obesity 04/01/2018   Vitamin D deficiency 04/01/2018   Anxiety 03/30/2018   Depression 03/30/2018   Essential hypertension 03/30/2018   Irritable bowel syndrome with constipation 03/30/2018   OSA (obstructive sleep apnea) 03/30/2018   Lower urinary tract symptoms (LUTS) 01/08/2017   Acne 01/06/2013   Past Surgical History:  Procedure Laterality Date   TUBAL LIGATION     OB History   No obstetric history on file.    Home Medications    Prior to Admission medications   Medication Sig Start Date End Date Taking? Authorizing Provider  amLODipine (NORVASC) 5 MG tablet TAKE 1 TABLET (5 MG TOTAL) BY MOUTH DAILY. 09/25/21   Georganna Skeans, MD  amoxicillin (AMOXIL) 500 MG capsule Take 1 capsule (500 mg total) by mouth 2 (two) times daily for 10 days. 10/12/21 10/22/21  Claiborne Rigg, NP  clotrimazole-betamethasone (LOTRISONE) cream Apply to affected area 2 times daily as needed 06/13/21   Wieters, Hallie C, PA-C  fluconazole (DIFLUCAN) 150 MG tablet Take 1 tablet (150 mg total) by mouth daily. Repeat in 3 days if needed 10/12/21   Claiborne Rigg, NP  hydrochlorothiazide (HYDRODIURIL) 50 MG tablet TAKE 1 TABLET BY MOUTH EVERY DAY 08/05/21   Georganna Skeans, MD  hydrocortisone cream 1 % Apply 1 application topically 2 (two) times daily. 08/28/21   Freddy Finner, NP  hydrOXYzine (VISTARIL) 25 MG capsule Take 1  capsule (25 mg total) by mouth every 8 (eight) hours as needed. 07/15/21   Rema Fendt, NP  polyethylene glycol powder (GLYCOLAX/MIRALAX) 17 GM/SCOOP powder Take 17 g by mouth daily. 03/20/21   Rema Fendt, NP  triamcinolone cream (KENALOG) 0.1 % Apply topically 2 (two) times daily as needed. 01/09/20   Cain Saupe, MD   Family History Family History  Problem Relation Age of Onset   Hypertension Mother    Heart attack Mother    Kidney disease Father    Diabetes Father    Social History Social History   Tobacco Use   Smoking status: Never   Smokeless tobacco: Never  Vaping Use   Vaping Use: Never used  Substance Use Topics   Alcohol use: Never   Drug use: Never   Allergies   Patient has no known allergies.  Review of Systems Review of Systems Pertinent findings noted in history of present illness.   Physical Exam Triage Vital Signs ED Triage Vitals  Enc Vitals Group     BP 08/16/21 0827 (!) 147/82     Pulse Rate 08/16/21 0827 72     Resp 08/16/21 0827 18     Temp 08/16/21 0827 98.3 F (36.8 C)     Temp Source 08/16/21 0827 Oral     SpO2 08/16/21 0827 98 %     Weight --      Height --  Head Circumference --      Peak Flow --      Pain Score 08/16/21 0826 5     Pain Loc --      Pain Edu? --      Excl. in GC? --   No data found.  Updated Vital Signs BP (!) 142/91 (BP Location: Right Arm)    Pulse 89    Temp 98.6 F (37 C) (Oral)    Resp 20    LMP 10/04/2021 (Approximate)    SpO2 97%   Physical Exam Constitutional:      General: She is not in acute distress.    Appearance: She is well-developed. She is ill-appearing. She is not toxic-appearing.  HENT:     Head: Normocephalic and atraumatic. No right periorbital erythema or left periorbital erythema.     Jaw: There is normal jaw occlusion. No tenderness, swelling, pain on movement or malocclusion.     Salivary Glands: Right salivary gland is diffusely enlarged and tender. Left salivary gland is  diffusely enlarged and tender.     Right Ear: Hearing, tympanic membrane, ear canal and external ear normal.     Left Ear: Hearing, tympanic membrane, ear canal and external ear normal.     Nose: No mucosal edema, congestion or rhinorrhea.     Right Turbinates: Not enlarged, swollen or pale.     Left Turbinates: Not enlarged or swollen.     Right Sinus: No maxillary sinus tenderness or frontal sinus tenderness.     Left Sinus: No maxillary sinus tenderness or frontal sinus tenderness.     Mouth/Throat:     Lips: Pink. No lesions.     Mouth: Mucous membranes are moist. No oral lesions or angioedema.     Dentition: No gingival swelling.     Tongue: No lesions.     Palate: No mass.     Pharynx: Uvula midline. Pharyngeal swelling, oropharyngeal exudate and posterior oropharyngeal erythema present. No uvula swelling.     Tonsils: Tonsillar exudate present. 2+ on the right. 2+ on the left.  Eyes:     General: Lids are normal. Vision grossly intact.     Extraocular Movements: Extraocular movements intact.     Conjunctiva/sclera: Conjunctivae normal.     Right eye: Right conjunctiva is not injected. No exudate.    Left eye: Left conjunctiva is not injected. No exudate.    Pupils: Pupils are equal, round, and reactive to light.  Neck:     Thyroid: No thyroid mass, thyromegaly or thyroid tenderness.     Trachea: Tracheal tenderness present. No abnormal tracheal secretions or tracheal deviation.     Comments: Voice is muffled Cardiovascular:     Rate and Rhythm: Normal rate and regular rhythm.     Pulses: Normal pulses.     Heart sounds: Normal heart sounds, S1 normal and S2 normal. No murmur heard.   No friction rub. No gallop.  Pulmonary:     Effort: Pulmonary effort is normal. No accessory muscle usage, prolonged expiration, respiratory distress or retractions.     Breath sounds: No stridor, decreased air movement or transmitted upper airway sounds. No decreased breath sounds, wheezing,  rhonchi or rales.  Abdominal:     General: Bowel sounds are normal.     Palpations: Abdomen is soft. There is no shifting dullness, hepatomegaly, splenomegaly, mass or pulsatile mass.     Tenderness: There is generalized abdominal tenderness. There is no right CVA tenderness, left CVA tenderness or rebound. Negative  signs include Murphy's sign.     Hernia: No hernia is present.  Musculoskeletal:        General: No tenderness. Normal range of motion.     Cervical back: Full passive range of motion without pain, normal range of motion and neck supple.     Right lower leg: No edema.     Left lower leg: No edema.  Lymphadenopathy:     Cervical: Cervical adenopathy present.     Right cervical: Superficial cervical adenopathy, deep cervical adenopathy and posterior cervical adenopathy present.     Left cervical: Superficial cervical adenopathy, deep cervical adenopathy and posterior cervical adenopathy present.  Skin:    General: Skin is warm and dry.     Findings: No erythema, lesion or rash.  Neurological:     General: No focal deficit present.     Mental Status: She is alert and oriented to person, place, and time. Mental status is at baseline.  Psychiatric:        Mood and Affect: Mood normal.        Behavior: Behavior normal.        Thought Content: Thought content normal.        Judgment: Judgment normal.    Visual Acuity Right Eye Distance:   Left Eye Distance:   Bilateral Distance:    Right Eye Near:   Left Eye Near:    Bilateral Near:     UC Couse / Diagnostics / Procedures:    EKG  Radiology No results found.  Procedures Procedures (including critical care time)  UC Diagnoses / Final Clinical Impressions(s)   I have reviewed the triage vital signs and the nursing notes.  Pertinent labs & imaging results that were available during my care of the patient were reviewed by me and considered in my medical decision making (see chart for details).   Final diagnoses:   Acute pharyngitis, unspecified etiology  Infectious mononucleosis without complication, infectious mononucleosis due to unspecified organism   Monospot test is positive.  Conservative care recommended.  Patient does not have a large spleen on exam today.  ED Prescriptions     Medication Sig Dispense Auth. Provider   lidocaine (XYLOCAINE) 2 % solution Use as directed 15 mLs in the mouth or throat every 3 (three) hours as needed for mouth pain (Sore throat). 300 mL Theadora Rama Scales, PA-C   ibuprofen (ADVIL) 600 MG tablet Take 1 tablet (600 mg total) by mouth every 8 (eight) hours as needed for up to 60 doses for fever, headache, mild pain or moderate pain (Inflammation). Take 1 tablet 3 times daily as needed for inflammation of upper airways and/or pain. 60 tablet Theadora Rama Scales, PA-C   acetaminophen (TYLENOL) 500 MG tablet Take 2 tablets (1,000 mg total) by mouth every 8 (eight) hours as needed for up to 60 doses for mild pain or fever. 120 tablet Theadora Rama Scales, PA-C      PDMP not reviewed this encounter.  Pending results:  Labs Reviewed  POCT MONO SCREEN ([facility]) - Abnormal; Notable for the following components:      Result Value   Mono, POC Positive (*)    All other components within normal limits  COVID-19, FLU A+B NAA  CULTURE, GROUP A STREP [facility])  POCT RAPID STREP A (OFFICE)    Medications Ordered in UC: Medications  ibuprofen (ADVIL) tablet 800 mg (800 mg Oral Given 10/16/21 1617)  lidocaine (XYLOCAINE) 2 % viscous mouth solution 15 mL (15 mLs Mouth/Throat  Given 10/16/21 1607)    Disposition Upon Discharge:  Condition: stable for discharge home Home: take medications as prescribed; routine discharge instructions as discussed; follow up as advised.  Patient presented with an acute illness with associated systemic symptoms and significant discomfort requiring urgent management. In my opinion, this is a condition that a prudent lay person (someone who  possesses an average knowledge of health and medicine) may potentially expect to result in complications if not addressed urgently such as respiratory distress, impairment of bodily function or dysfunction of bodily organs.   Routine symptom specific, illness specific and/or disease specific instructions were discussed with the patient and/or caregiver at length.   As such, the patient has been evaluated and assessed, work-up was performed and treatment was provided in alignment with urgent care protocols and evidence based medicine.  Patient/parent/caregiver has been advised that the patient may require follow up for further testing and treatment if the symptoms continue in spite of treatment, as clinically indicated and appropriate.  If the patient was tested for COVID-19, Influenza and/or RSV, then the patient/parent/guardian was advised to isolate at home pending the results of his/her diagnostic coronavirus test and potentially longer if theyre positive. I have also advised pt that if his/her COVID-19 test returns positive, it's recommended to self-isolate for at least 10 days after symptoms first appeared AND until fever-free for 24 hours without fever reducer AND other symptoms have improved or resolved. Discussed self-isolation recommendations as well as instructions for household member/close contacts as per the [facility] and [place] DHHS, and also gave patient the COVID packet with this information.  Patient/parent/caregiver has been advised to return [facility] or PCP in 3-5 days if no better; to PCP or the Emergency Department if new signs and symptoms develop, or if the current signs or symptoms continue to change or worsen for further workup, evaluation and treatment as clinically indicated and appropriate  The patient will follow up with their current PCP if and as advised. If the patient does not currently have a PCP we will assist them in obtaining one.   The patient may need specialty follow up  if the symptoms continue, in spite of conservative treatment and management, for further workup, evaluation, consultation and treatment as clinically indicated and appropriate.  Patient/parent/caregiver verbalized understanding and agreement of plan as discussed.  All questions were addressed during visit.  Please see discharge instructions below for further details of plan.  Discharge Instructions:   Discharge Instructions      Your monotest today was positive.  It is very unusual to see mono in adults but it does happen every now and then.  The treatment for this is conservative care.  I have enclosed some information about mono, however it is transmitted, symptoms to be concerned about, and how to care for yourself at home.  I have you with prescriptions for viscous lidocaine that you can use every 3 hours while awake for relief of pain from sore throat, ibuprofen 600 mg and Tylenol 1000 mg, both for fever, body ache and pain.  I have provided you with a note to be out of work until January 2, but as you can read in the information provided, symptoms can linger for up to 4 weeks.  It is important that you reach out to your primary care provider should you require a note to be out of work longer than the short-term I am able to provide you in the urgent care setting.  We do not  fill out FMLA or disability paperwork in urgent care.      This office note has been dictated using Teaching laboratory technician.  Unfortunately, and despite my best efforts, this method of dictation can sometimes lead to occasional typographical or grammatical errors.  I apologize in advance if this occurs.     Theadora Rama Scales, PA-C 10/16/21 [phone]

## 2021-10-16 NOTE — Discharge Instructions (Addendum)
Your monotest today was positive.  It is very unusual to see mono in adults but it does happen every now and then.  The treatment for this is conservative care.  I have enclosed some information about mono, however it is transmitted, symptoms to be concerned about, and how to care for yourself at home.  I have you with prescriptions for viscous lidocaine that you can use every 3 hours while awake for relief of pain from sore throat, ibuprofen 600 mg and Tylenol 1000 mg, both for fever, body ache and pain.  I have provided you with a note to be out of work until January 2, but as you can read in the information provided, symptoms can linger for up to 4 weeks.  It is important that you reach out to your primary care provider should you require a note to be out of work longer than the short-term I am able to provide you in the urgent care setting.  We do not fill out FMLA or disability paperwork in urgent care.

## 2021-10-17 LAB — COVID-19, FLU A+B NAA
Influenza A, NAA: NOT DETECTED
Influenza B, NAA: NOT DETECTED
SARS-CoV-2, NAA: NOT DETECTED

## 2021-10-20 LAB — CULTURE, GROUP A STREP (THRC)

## 2021-10-21 ENCOUNTER — Telehealth: Payer: Self-pay

## 2021-10-21 NOTE — Telephone Encounter (Signed)
Patient called wanting to know more about her results from prior visit. Patient educated on results and notified that these results are rapid results and that we do not give any numerical data. Patient requesting to have blood work done and patient made aware that she would have to f/u with her PCP (per provider).

## 2021-10-24 NOTE — Progress Notes (Signed)
Patient ID: Vanessa Burton, female    DOB: June 12, 1976  MRN: [id number]  CC: Anxiety Depression Follow-Up   Subjective: Vanessa Burton is a 46 y.o. female who presents for anxiety depression follow-up.   Her concerns today include:  ANXIETY DEPRESSION FOLLOW-UP: 07/15/2021: - Begin Hydroxyzine as prescribed.  10/31/2021: Doing well on current regimen. Reports taking medication at bedtime and sometimes feeling drowsy the next morning. Feeling more anxious because of recent diagnosis of mono.  2. URGENT CARE FOLLOW-UP: 10/16/2021 at [facility] at [facility] per PA note: Your monotest today was positive.  It is very unusual to see mono in adults but it does happen every now and then. The treatment for this is conservative care.  I have enclosed some information about mono, however it is transmitted, symptoms to be concerned about, and how to care for yourself at home.  I have you with prescriptions for viscous lidocaine that you can use every 3 hours while awake for relief of pain from sore throat, ibuprofen 600 mg and Tylenol 1000 mg, both for fever, body ache and pain.  I have provided you with a note to be out of work until January 2, but as you can read in the information provided, symptoms can linger for up to 4 weeks.  It is important that you reach out to your primary care provider should you require a note to be out of work longer than the short-term I am able to provide you in the urgent care setting.  We do not fill out FMLA or disability paperwork in urgent care.  10/31/2021: Overall feeling improved, sore throat resolved. Reports still feeling fatigued. She did take lidocaine and Tylenol as instructed at Urgent Care discharge.   Depression screen [facility] 2/9 10/31/2021 07/15/2021 03/20/2021 01/04/2021 12/19/2020  Decreased Interest 2 2 1 2  0  Down, Depressed, Hopeless 1 1 1 2  0  PHQ - 2 Score 3 3 2 4  0  Altered sleeping 3 3 1 2  -  Tired, decreased energy 3 3 1 2  -   Change in appetite 1 1 1 3  -  Feeling bad or failure about yourself  0 0 1 0 -  Trouble concentrating 3 3 1 1  -  Moving slowly or fidgety/restless 0 1 0 0 -  Suicidal thoughts 0 0 0 0 -  PHQ-9 Score 13 14 7 12  -  Difficult doing work/chores Somewhat difficult Somewhat difficult Not difficult at all Somewhat difficult -    Patient Active Problem List   Diagnosis Date Noted   GERD (gastroesophageal reflux disease) 06/29/2018   Bilateral leg edema 04/01/2018   Obesity 04/01/2018   Vitamin D deficiency 04/01/2018   Anxiety 03/30/2018   Depression 03/30/2018   Essential hypertension 03/30/2018   Irritable bowel syndrome with constipation 03/30/2018   OSA (obstructive sleep apnea) 03/30/2018   Lower urinary tract symptoms (LUTS) 01/08/2017   Acne 01/06/2013     Current Outpatient Medications on File Prior to Visit  Medication Sig Dispense Refill   acetaminophen (TYLENOL) 500 MG tablet Take 2 tablets (1,000 mg total) by mouth every 8 (eight) hours as needed for up to 60 doses for mild pain or fever. 120 tablet 0   amLODipine (NORVASC) 5 MG tablet TAKE 1 TABLET (5 MG TOTAL) BY MOUTH DAILY. 30 tablet 0   clotrimazole-betamethasone (LOTRISONE) cream Apply to affected area 2 times daily as needed 45 g 0   hydrochlorothiazide (HYDRODIURIL) 50 MG tablet TAKE 1 TABLET BY MOUTH  EVERY DAY 90 tablet 0   hydrOXYzine (VISTARIL) 25 MG capsule Take 1 capsule (25 mg total) by mouth every 8 (eight) hours as needed. 30 capsule 0   ibuprofen (ADVIL) 600 MG tablet Take 1 tablet (600 mg total) by mouth every 8 (eight) hours as needed for up to 60 doses for fever, headache, mild pain or moderate pain (Inflammation). Take 1 tablet 3 times daily as needed for inflammation of upper airways and/or pain. 60 tablet 0   polyethylene glycol powder (GLYCOLAX/MIRALAX) 17 GM/SCOOP powder Take 17 g by mouth daily. 3350 g 0   No current facility-administered medications on file prior to visit.    No Known  Allergies  Social History   Socioeconomic History   Marital status: Single    Spouse name: Not on file   Number of children: 2   Years of education: Not on file   Highest education level: Not on file  Occupational History   Occupation: school bus driver  Tobacco Use   Smoking status: Never   Smokeless tobacco: Never  Vaping Use   Vaping Use: Never used  Substance and Sexual Activity   Alcohol use: Never   Drug use: Never   Sexual activity: Not on file  Other Topics Concern   Not on file  Social History Narrative   Not on file   Social Determinants of Health   Financial Resource Strain: Not on file  Food Insecurity: Not on file  Transportation Needs: Not on file  Physical Activity: Not on file  Stress: Not on file  Social Connections: Not on file  Intimate Partner Violence: Not on file    Family History  Problem Relation Age of Onset   Hypertension Mother    Heart attack Mother    Kidney disease Father    Diabetes Father     Past Surgical History:  Procedure Laterality Date   TUBAL LIGATION      ROS: Review of Systems Negative except as stated above  PHYSICAL EXAM: BP 124/82 (BP Location: Left Arm, Patient Position: Sitting, Cuff Size: Large)    Pulse 78    Temp 98 F (36.7 C)    Resp 18    Ht 5' 6.81" (1.697 m)    Wt 199 lb (90.3 kg)    LMP 10/04/2021 (Approximate)    SpO2 98%    BMI 31.34 kg/m   Physical Exam HENT:     Head: Normocephalic and atraumatic.  Eyes:     Extraocular Movements: Extraocular movements intact.     Conjunctiva/sclera: Conjunctivae normal.     Pupils: Pupils are equal, round, and reactive to light.  Cardiovascular:     Rate and Rhythm: Normal rate and regular rhythm.     Pulses: Normal pulses.     Heart sounds: Normal heart sounds.  Pulmonary:     Effort: Pulmonary effort is normal.     Breath sounds: Normal breath sounds.  Musculoskeletal:     Cervical back: Normal range of motion and neck supple.  Neurological:      General: No focal deficit present.     Mental Status: She is alert and oriented to person, place, and time.  Psychiatric:        Mood and Affect: Mood normal.        Behavior: Behavior normal.   ASSESSMENT AND PLAN: 1. Anxiety and depression: - Patient denies thoughts of self-harm, suicidal ideations, homicidal ideations. - Continue Hydroxyzine as prescribed.  - Follow-up with primary provider as  scheduled.  - hydrOXYzine (VISTARIL) 25 MG capsule; Take 1 capsule (25 mg total) by mouth every 8 (eight) hours as needed.  Dispense: 30 capsule; Refill: 2  2. Acute pharyngitis, unspecified etiology: 3. Infectious mononucleosis without complication, infectious mononucleosis due to unspecified organism: - Recent appointment at [facility] on 10/16/2021 with positive point-of-care Monospot. - Will obtain lab today to confirm.  - Follow-up with primary provider as scheduled.  - Mononucleosis Test, Qual W/ Reflex   Patient was given the opportunity to ask questions.  Patient verbalized understanding of the plan and was able to repeat key elements of the plan. Patient was given clear instructions to go to Emergency Department or return to medical center if symptoms don't improve, worsen, or new problems develop.The patient verbalized understanding.   Orders Placed This Encounter  Procedures   Mononucleosis Test, Qual W/ Reflex   Follow-up with primary provider as scheduled.  Rema FendtAmy J Philip Eckersley, NP

## 2021-10-31 ENCOUNTER — Ambulatory Visit (INDEPENDENT_AMBULATORY_CARE_PROVIDER_SITE_OTHER): Payer: 59 | Admitting: Family

## 2021-10-31 ENCOUNTER — Other Ambulatory Visit: Payer: Self-pay

## 2021-10-31 ENCOUNTER — Encounter: Payer: Self-pay | Admitting: Family

## 2021-10-31 VITALS — BP 124/82 | HR 78 | Temp 98.0°F | Resp 18 | Ht 66.81 in | Wt 199.0 lb

## 2021-10-31 DIAGNOSIS — B2790 Infectious mononucleosis, unspecified without complication: Secondary | ICD-10-CM

## 2021-10-31 DIAGNOSIS — F419 Anxiety disorder, unspecified: Secondary | ICD-10-CM | POA: Diagnosis not present

## 2021-10-31 DIAGNOSIS — J029 Acute pharyngitis, unspecified: Secondary | ICD-10-CM | POA: Diagnosis not present

## 2021-10-31 DIAGNOSIS — F32A Depression, unspecified: Secondary | ICD-10-CM | POA: Diagnosis not present

## 2021-10-31 DIAGNOSIS — R69 Illness, unspecified: Secondary | ICD-10-CM | POA: Diagnosis not present

## 2021-10-31 DIAGNOSIS — Z803 Family history of malignant neoplasm of breast: Secondary | ICD-10-CM | POA: Diagnosis not present

## 2021-10-31 MED ORDER — HYDROXYZINE PAMOATE 25 MG PO CAPS: 25.0000 mg | ORAL_CAPSULE | Freq: Three times a day (TID) | ORAL | 2 refills | Status: DC | PRN

## 2021-10-31 NOTE — Patient Instructions (Signed)
Infectious Mononucleosis Infectious mononucleosis is a viral infection. It is often referred to as "mono." It causes symptoms that affect various areas of the body, including the throat, upper air passages, and lymph glands. The liver or spleen may also be affected. The virus spreads from person to person (is contagious) through close contact. The illness is usually not serious, and it typically goes away in 2-4 weeks without treatment. In rare cases, symptoms can be more severe and last longer, sometimes up to several months. What are the causes? This condition is commonly caused by the Epstein-Barr virus (EBV). This virus spreads through: Having contact with an infected person's saliva or other bodily fluids, often through: Kissing. Sex. Coughing. Sneezing. Sharing utensils or drinking glasses with an infected person. Receiving blood from an infected donor (blood transfusion). Receiving an organ from an infected donor (organ transplant). What increases the risk? You are more likely to develop this condition if: You are 15-24 years old. What are the signs or symptoms? Symptoms of this condition usually appear 4-6 weeks after infection. Symptoms may develop slowly and occur at different times. Common symptoms include: Mild symptoms of this condition include: Headache. Muscle aches. Swollen glands. Poor appetite. Moderate symptoms of this condition include: Sore throat. Fever. Rash. Nausea Other symptoms may include: Extreme fatigue. Enlarged liver or spleen. Abdominal pain. How is this diagnosed? This condition may be diagnosed based on: Your medical history. Your symptoms. A physical exam. Blood tests to confirm the diagnosis. How is this treated? There is no cure for this condition. Infectious mononucleosis usually goes away on its own with time. Treatment can help relieve symptoms and may include: Drinking plenty of fluids. Getting a lot of rest. Taking medicines to  relieve pain and fever. Medicine (corticosteroids) to reduce swelling. This may be used if swelling in the throat causes breathing or swallowing problems. In some severe cases, treatment may have to be given in a hospital. Follow these instructions at home: Medicines Take over-the-counter and prescription medicines only as told by your health care provider. Do not take the antibiotics ampicillin or amoxicillin. This may cause a rash. If you are under 18, do not take aspirin because of the association with Reye's syndrome. Activity Rest as needed. Do not participate in any of the following activities until your health care provider approves: Exercise that requires a lot of energy. Heavy lifting. Contact sports. You may need to wait at least a month before participating in sports. Gradually resume your normal activities after your fever is gone, or when your health care provider tells you that you can. Be sure to rest when you get tired. General instructions  Avoid kissing or sharing utensils or drinking glasses until your health care provider tells you that you are no longer contagious. Drink enough fluid to keep your urine pale yellow. Do not drink alcohol. If you have a sore throat: Gargle with a salt-water mixture 3-4 times a day or as needed. To make a salt-water mixture, completely dissolve -1 tsp (3-6 g) of salt in 1 cup (237 mL) of warm water. Eat soft foods. Cold foods such as ice cream or ice pops can soothe a sore throat. Try sucking on hard candy or lozenges. Keep all follow-up visits. This is important. How is this prevented?  Avoid contact with people who are infected with mononucleosis. An infected person may not always appear ill, but he or she can still spread the virus. Avoid sharing utensils, drinking glasses, or toothbrushes. Wash your hands   frequently for at least 20 seconds with soap and water. If soap and water are not available, use hand sanitizer. Use the inside  of your elbow to cover your mouth when coughing or sneezing. Where to find more information Centers for Disease Control and Prevention: www.cdc.gov Contact a health care provider if: Your fever is not gone after 10 days. You have swollen lymph nodes that are not back to normal after 4 weeks. Your activity level is not back to normal after 2 months. Your skin or the white parts of your eyes turn yellow (jaundice). You have constipation. You may have constipation if you are having: Fewer bowel movements in a week than normal. Difficulty passing stool. Stools that are dry, hard, or larger than normal. Get help right away if: You cannot stop vomiting. You are drooling or have trouble swallowing. You have signs of dehydration. These may include: Weakness. Pale skin. Sunken eyes or dry mouth. Rapid breathing or pulse. You have trouble breathing. You develop a stiff neck or a severe headache. You have severe pain in your abdomen or shoulder. You are confused or you have trouble with balance. You have jerky movements that you cannot control (seizures). Your nose or gums begin to bleed. Some of these symptoms may represent a serious problem that is an emergency. Do not wait to see if the symptoms will go away. Get medical help right away. Call your local emergency services (911 in the [place] Do not drive yourself to the hospital. Summary Infectious mononucleosis, or "mono," is an infection caused by the Epstein-Barr virus. The virus that causes this condition is spread through bodily fluids. The virus is most commonly spread by kissing or sharing drinks or utensils with an infected person. You are more likely to develop this condition if you are 15-24 years old. Symptoms of this condition include sore throat, headache, fever, swollen glands, muscle aches, and extreme fatigue. There is no cure for this condition. Treatment can help relieve symptoms and may include drinking plenty of fluids,  getting a lot of rest, or taking medicines. This information is not intended to replace advice given to you by your health care provider. Make sure you discuss any questions you have with your health care provider. Document Revised: 09/21/2020 Document Reviewed: 09/21/2020 Elsevier Patient Education  2022 Elsevier Inc.  

## 2021-10-31 NOTE — Progress Notes (Signed)
Pt presents for anxiety due to positive mono test on 12/28

## 2021-11-01 LAB — MONO QUAL W/RFLX QN: Mono Qual W/Rflx Qn: POSITIVE — AB

## 2021-11-01 LAB — MONONUCLEOSIS TEST, QUANT: MONO TITER: 1:1 {titer} — ABNORMAL HIGH

## 2021-11-02 NOTE — Progress Notes (Signed)
Mononucleosis blood test positive. Will continue with plan discussed at Urgent Care and in office.

## 2021-11-18 ENCOUNTER — Telehealth: Payer: Self-pay | Admitting: Family

## 2021-11-18 ENCOUNTER — Other Ambulatory Visit: Payer: Self-pay

## 2021-11-18 DIAGNOSIS — I10 Essential (primary) hypertension: Secondary | ICD-10-CM

## 2021-11-18 MED ORDER — HYDROCHLOROTHIAZIDE 50 MG PO TABS: 50.0000 mg | ORAL_TABLET | Freq: Every day | ORAL | 0 refills | Status: DC

## 2021-11-18 MED ORDER — AMLODIPINE BESYLATE 5 MG PO TABS: 5.0000 mg | ORAL_TABLET | Freq: Every day | ORAL | 0 refills | Status: DC

## 2021-11-18 NOTE — Telephone Encounter (Signed)
hydrochlorothiazide (HYDRODIURIL) 50 MG tablet [[id number]]  amLODipine (NORVASC) 5 MG tablet [[id number]]   Pharmacy  [id number] [place], [place] - [address]  [address] [place] [address]  Phone:  [phone]  Fax:  [phone]

## 2021-11-18 NOTE — Telephone Encounter (Signed)
Medication refill request completed.  

## 2021-12-28 NOTE — Progress Notes (Unsigned)
Patient ID: Vanessa Burton, female    DOB: [birth year]  MRN: [id number]  CC: Hypertension Follow-Up  Subjective: Vanessa Burton is a 46 y.o. female who presents for hypertension follow-up.   Her concerns today include:  HYPERTENSION FOLLOW-UP: HCTZ  Amlodipine   2. ANXIETY DEPRESSION FOLLOW-UP: Hydroxyzine  Patient Active Problem List   Diagnosis Date Noted   GERD (gastroesophageal reflux disease) 06/29/2018   Bilateral leg edema 04/01/2018   Obesity 04/01/2018   Vitamin D deficiency 04/01/2018   Anxiety 03/30/2018   Depression 03/30/2018   Essential hypertension 03/30/2018   Irritable bowel syndrome with constipation 03/30/2018   OSA (obstructive sleep apnea) 03/30/2018   Lower urinary tract symptoms (LUTS) 01/08/2017   Acne 01/06/2013     Current Outpatient Medications on File Prior to Visit  Medication Sig Dispense Refill   acetaminophen (TYLENOL) 500 MG tablet Take 2 tablets (1,000 mg total) by mouth every 8 (eight) hours as needed for up to 60 doses for mild pain or fever. 120 tablet 0   amLODipine (NORVASC) 5 MG tablet Take 1 tablet (5 mg total) by mouth daily. 90 tablet 0   clotrimazole-betamethasone (LOTRISONE) cream Apply to affected area 2 times daily as needed 45 g 0   hydrochlorothiazide (HYDRODIURIL) 50 MG tablet Take 1 tablet (50 mg total) by mouth daily. 90 tablet 0   hydrOXYzine (VISTARIL) 25 MG capsule Take 1 capsule (25 mg total) by mouth every 8 (eight) hours as needed. 30 capsule 2   ibuprofen (ADVIL) 600 MG tablet Take 1 tablet (600 mg total) by mouth every 8 (eight) hours as needed for up to 60 doses for fever, headache, mild pain or moderate pain (Inflammation). Take 1 tablet 3 times daily as needed for inflammation of upper airways and/or pain. 60 tablet 0   polyethylene glycol powder (GLYCOLAX/MIRALAX) 17 GM/SCOOP powder Take 17 g by mouth daily. 3350 g 0   No current facility-administered medications on file prior to visit.    No Known  Allergies  Social History   Socioeconomic History   Marital status: Single    Spouse name: Not on file   Number of children: 2   Years of education: Not on file   Highest education level: Not on file  Occupational History   Occupation: school bus driver  Tobacco Use   Smoking status: Never   Smokeless tobacco: Never  Vaping Use   Vaping Use: Never used  Substance and Sexual Activity   Alcohol use: Never   Drug use: Never   Sexual activity: Not on file  Other Topics Concern   Not on file  Social History Narrative   Not on file   Social Determinants of Health   Financial Resource Strain: Not on file  Food Insecurity: Not on file  Transportation Needs: Not on file  Physical Activity: Not on file  Stress: Not on file  Social Connections: Not on file  Intimate Partner Violence: Not on file    Family History  Problem Relation Age of Onset   Hypertension Mother    Heart attack Mother    Kidney disease Father    Diabetes Father     Past Surgical History:  Procedure Laterality Date   TUBAL LIGATION      ROS: Review of Systems Negative except as stated above  PHYSICAL EXAM: There were no vitals taken for this visit.  Physical Exam  {female adult master:310786} {female adult master:310785}  CMP Latest Ref Rng & Units 01/04/2021 10/23/2020  Glucose  70 - 99 mg/dL - 94  BUN 6 - 20 mg/dL - 11  Creatinine 7.12 - 1.00 mg/dL - 4.58  Sodium 099 - 833 mmol/L - 137  Potassium 3.5 - 5.1 mmol/L - 3.9  Chloride 98 - 111 mmol/L - 104  CO2 22 - 32 mmol/L - 25  Calcium 8.9 - 10.3 mg/dL - 9.2  Total Protein 6.0 - 8.5 g/dL 8.5 -  Total Bilirubin 0.0 - 1.2 mg/dL 0.6 -  Alkaline Phos 44 - 121 IU/L 92 -  AST 0 - 40 IU/L 21 -  ALT 0 - 32 IU/L 13 -   Lipid Panel     Component Value Date/Time   CHOL 185 01/04/2021 1452   TRIG 43 01/04/2021 1452   HDL 47 01/04/2021 1452   CHOLHDL 3.9 01/04/2021 1452   LDLCALC 130 (H) 01/04/2021 1452    CBC    Component Value Date/Time    WBC 4.9 10/23/2020 0853   RBC 3.97 10/23/2020 0853   HGB 11.6 (L) 10/23/2020 0853   HCT 37.0 10/23/2020 0853   PLT 309 10/23/2020 0853   MCV 93.2 10/23/2020 0853   MCH 29.2 10/23/2020 0853   MCHC 31.4 10/23/2020 0853   RDW 14.4 10/23/2020 0853   LYMPHSABS 2.0 10/23/2020 0853   MONOABS 0.5 10/23/2020 0853   EOSABS 0.2 10/23/2020 0853   BASOSABS 0.0 10/23/2020 0853    ASSESSMENT AND PLAN:  There are no diagnoses linked to this encounter.   Patient was given the opportunity to ask questions.  Patient verbalized understanding of the plan and was able to repeat key elements of the plan. Patient was given clear instructions to go to Emergency Department or return to medical center if symptoms don't improve, worsen, or new problems develop.The patient verbalized understanding.   No orders of the defined types were placed in this encounter.    Requested Prescriptions    No prescriptions requested or ordered in this encounter    No follow-ups on file.  Rema Fendt, NP

## 2022-01-01 ENCOUNTER — Encounter: Payer: 59 | Admitting: Family

## 2022-01-01 DIAGNOSIS — F32A Depression, unspecified: Secondary | ICD-10-CM

## 2022-01-01 DIAGNOSIS — F419 Anxiety disorder, unspecified: Secondary | ICD-10-CM

## 2022-01-01 DIAGNOSIS — I10 Essential (primary) hypertension: Secondary | ICD-10-CM

## 2022-01-06 DIAGNOSIS — I10 Essential (primary) hypertension: Secondary | ICD-10-CM | POA: Diagnosis not present

## 2022-01-06 DIAGNOSIS — G4733 Obstructive sleep apnea (adult) (pediatric): Secondary | ICD-10-CM | POA: Diagnosis not present

## 2022-02-13 ENCOUNTER — Other Ambulatory Visit: Payer: Self-pay | Admitting: Family

## 2022-02-13 DIAGNOSIS — I10 Essential (primary) hypertension: Secondary | ICD-10-CM

## 2022-02-13 NOTE — Telephone Encounter (Signed)
Requested Prescriptions  ?Pending Prescriptions Disp Refills  ?? amLODipine (NORVASC) 5 MG tablet [Pharmacy Med Name: AMLODIPINE BESYLATE 5 MG TAB] 90 tablet 0  ?  Sig: TAKE 1 TABLET BY MOUTH EVERY DAY  ?  ? Cardiovascular: Calcium Channel Blockers 2 Passed - 02/13/2022  2:50 AM  ?  ?  Passed - Last BP in normal range  ?  BP Readings from Last 1 Encounters:  ?10/31/21 124/82  ?   ?  ?  Passed - Last Heart Rate in normal range  ?  Pulse Readings from Last 1 Encounters:  ?10/31/21 78  ?   ?  ?  Passed - Valid encounter within last 6 months  ?  Recent Outpatient Visits   ?      ? 3 months ago Anxiety and depression  ? Primary Care at [facility], Amy J, NP  ? 7 months ago Anxiety and depression  ? Primary Care at [facility], Amy J, NP  ? 11 months ago Abdominal pain, unspecified abdominal location  ? Primary Care at [facility], [place], [place]  ? 1 year ago Annual physical exam  ? Primary Care at [facility], [place], [place]  ? 1 year ago Encounter to establish care  ? Primary Care at [facility], [place], [place]  ?  ?  ? ?  ?  ?  ? ? ?

## 2022-02-20 ENCOUNTER — Other Ambulatory Visit: Payer: Self-pay | Admitting: Family

## 2022-02-20 DIAGNOSIS — I10 Essential (primary) hypertension: Secondary | ICD-10-CM

## 2022-02-20 NOTE — Telephone Encounter (Signed)
Requested Prescriptions  ?Pending Prescriptions Disp Refills  ?? hydrochlorothiazide (HYDRODIURIL) 50 MG tablet [Pharmacy Med Name: HYDROCHLOROTHIAZIDE 50 MG TAB] 90 tablet 0  ?  Sig: TAKE 1 TABLET BY MOUTH EVERY DAY  ?  ? Cardiovascular: Diuretics - Thiazide Failed - 02/20/2022  2:48 AM  ?  ?  Failed - Cr in normal range and within 180 days  ?  Creatinine, Ser  ?Date Value Ref Range Status  ?10/23/2020 0.79 0.44 - 1.00 mg/dL Final  ?   ?  ?  Failed - K in normal range and within 180 days  ?  Potassium  ?Date Value Ref Range Status  ?10/23/2020 3.9 3.5 - 5.1 mmol/L Final  ?   ?  ?  Failed - Na in normal range and within 180 days  ?  Sodium  ?Date Value Ref Range Status  ?10/23/2020 137 135 - 145 mmol/L Final  ?   ?  ?  Passed - Last BP in normal range  ?  BP Readings from Last 1 Encounters:  ?10/31/21 124/82  ?   ?  ?  Passed - Valid encounter within last 6 months  ?  Recent Outpatient Visits   ?      ? 3 months ago Anxiety and depression  ? Primary Care at [facility], Amy J, NP  ? 7 months ago Anxiety and depression  ? Primary Care at [facility], Amy J, NP  ? 11 months ago Abdominal pain, unspecified abdominal location  ? Primary Care at [facility], [place], [place]  ? 1 year ago Annual physical exam  ? Primary Care at [facility], [place], [place]  ? 1 year ago Encounter to establish care  ? Primary Care at [facility], [place], [place]  ?  ?  ? ?  ?  ?  ? ? ?

## 2022-03-19 ENCOUNTER — Ambulatory Visit: Payer: Self-pay | Admitting: *Deleted

## 2022-03-19 NOTE — Telephone Encounter (Signed)
Reason for Disposition  Symptoms interfere with work or school  Answer Assessment - Initial Assessment Questions 1. CONCERN: "Did anything happen that prompted you to call today?"      Leg swelling, feels she needs rest- but has too much to do 2. ANXIETY SYMPTOMS: "Can you describe how you (your loved one; patient) have been feeling?" (e.g., tense, restless, panicky, anxious, keyed up, overwhelmed, sense of impending doom).      overwhelmed 3. ONSET: "How long have you been feeling this way?" (e.g., hours, days, weeks)     Ongoing 2 weeks 4. SEVERITY: "How would you rate the level of anxiety?" (e.g., 0 - 10; or mild, moderate, severe).     severe 5. FUNCTIONAL IMPAIRMENT: "How have these feelings affected your ability to do daily activities?" "Have you had more difficulty than usual doing your normal daily activities?" (e.g., getting better, same, worse; self-care, school, work, interactions)     Patient has had to take sick days 6. HISTORY: "Have you felt this way before?" "Have you ever been diagnosed with an anxiety problem in the past?" (e.g., generalized anxiety disorder, panic attacks, PTSD). If Yes, ask: "How was this problem treated?" (e.g., medicines, counseling, etc.)     Yes- seemed more manageable  7. RISK OF HARM - SUICIDAL IDEATION: "Do you ever have thoughts of hurting or killing yourself?" If Yes, ask:  "Do you have these feelings now?" "Do you have a plan on how you would do this?"     no 8. TREATMENT:  "What has been done so far to treat this anxiety?" (e.g., medicines, relaxation strategies). "What has helped?"     Yes- makes her tired 9. TREATMENT - THERAPIST: "Do you have a counselor or therapist? Name?"     no 10. POTENTIAL TRIGGERS: "Do you drink caffeinated beverages (e.g., coffee, colas, teas), and how much daily?" "Do you drink alcohol or use any drugs?" "Have you started any new medicines recently?"     No drugs.alcohol, no new medication 10. PATIENT SUPPORT: "Who  is with you now?" "Who do you live with?" "Do you have family or friends who you can talk to?"          11. OTHER SYMPTOMS: "Do you have any other symptoms?" (e.g., feeling depressed, trouble concentrating, trouble sleeping, trouble breathing, palpitations or fast heartbeat, chest pain, sweating, nausea, or diarrhea)       headaches 12. PREGNANCY: "Is there any chance you are pregnant?" "When was your last menstrual period?"  Protocols used: Anxiety and Panic Attack-A-AH

## 2022-03-19 NOTE — Telephone Encounter (Signed)
  Chief Complaint: increased anxiety, leg swelling, headache Symptoms: leg swelling, headache, anxiety Frequency: 2 weeks- not better Pertinent Negatives: Patient denies suicidal thoughts, drugs, alcohol abuse Disposition: [] ED /[] Urgent Care (no appt availability in office) / [x] Appointment(In office/virtual)/ []  [place] [facility]/ [] Home Care/ [] Refused Recommended Disposition /[] [place] [facility]/ []  Follow-up with PCP Additional Notes:

## 2022-03-20 ENCOUNTER — Encounter: Payer: Self-pay | Admitting: Physician Assistant

## 2022-03-20 ENCOUNTER — Ambulatory Visit (INDEPENDENT_AMBULATORY_CARE_PROVIDER_SITE_OTHER): Payer: 59 | Admitting: Physician Assistant

## 2022-03-20 VITALS — BP 120/81 | HR 61 | Temp 98.2°F | Resp 18 | Ht 66.0 in | Wt 209.0 lb

## 2022-03-20 DIAGNOSIS — Z8619 Personal history of other infectious and parasitic diseases: Secondary | ICD-10-CM

## 2022-03-20 DIAGNOSIS — E6609 Other obesity due to excess calories: Secondary | ICD-10-CM

## 2022-03-20 DIAGNOSIS — B2790 Infectious mononucleosis, unspecified without complication: Secondary | ICD-10-CM | POA: Diagnosis not present

## 2022-03-20 DIAGNOSIS — R69 Illness, unspecified: Secondary | ICD-10-CM | POA: Diagnosis not present

## 2022-03-20 DIAGNOSIS — G4733 Obstructive sleep apnea (adult) (pediatric): Secondary | ICD-10-CM

## 2022-03-20 DIAGNOSIS — F32A Depression, unspecified: Secondary | ICD-10-CM | POA: Diagnosis not present

## 2022-03-20 DIAGNOSIS — E559 Vitamin D deficiency, unspecified: Secondary | ICD-10-CM

## 2022-03-20 DIAGNOSIS — R600 Localized edema: Secondary | ICD-10-CM

## 2022-03-20 DIAGNOSIS — I10 Essential (primary) hypertension: Secondary | ICD-10-CM | POA: Diagnosis not present

## 2022-03-20 DIAGNOSIS — F419 Anxiety disorder, unspecified: Secondary | ICD-10-CM

## 2022-03-20 DIAGNOSIS — Z6833 Body mass index (BMI) 33.0-33.9, adult: Secondary | ICD-10-CM

## 2022-03-20 DIAGNOSIS — R7303 Prediabetes: Secondary | ICD-10-CM | POA: Diagnosis not present

## 2022-03-20 LAB — POCT GLYCOSYLATED HEMOGLOBIN (HGB A1C): Hemoglobin A1C: 5.6 % (ref 4.0–5.6)

## 2022-03-20 NOTE — Progress Notes (Signed)
Established Patient Office Visit  Subjective   Patient ID: Vanessa Burton, female    DOB: 1976/10/05  Age: 46 y.o. MRN: [id number]  Chief Complaint  Patient presents with   Leg Swelling    Bilateral    Patient presents with several complaints.  States that she has been having bilateral leg swelling, increased episodes of binging due to anxiety, difficulty sleeping, and request recheck of recent positive mono test.  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  States that the swelling becomes worse during the day but does improve overnight.  States that she felt her entire body was swollen including her face and that she was having difficulty getting a deep breath, states that this occurred a few days ago.  States that she does sleep with the CPAP machine, states that she moved from [place] a couple of years ago and has not been able to follow-up with a sleep specialist since then and is worried her machine may need to be adjusted.  States that she has been having increased anxiety, states that she has been having increased episodes of binging, states that she will eat both sweet and salty foods during these episodes.  States that she has having difficulty sleeping both falling asleep and staying asleep despite taking 25 mg of hydroxyzine.  States that she is sleeping approximately 5 to 6 hours  States that she has previously been prescribed vitamin D weekly supplementation for vitamin D deficiency.  Does request a recheck of her positive mononucleosis titer.     03/20/2022    1:34 PM 10/31/2021    8:30 AM 07/15/2021    2:30 PM 03/20/2021    9:32 AM 01/04/2021    2:13 PM  Depression screen PHQ 2/9  Decreased Interest 2 2 2 1 2   Down, Depressed, Hopeless 2 1 1 1 2   PHQ - 2 Score 4 3 3 2 4   Altered sleeping 3 3 3 1 2   Tired, decreased energy 3 3 3 1 2    Change in appetite 3 1 1 1 3   Feeling bad or failure about yourself  2 0 0 1 0  Trouble concentrating 2 3 3 1 1   Moving slowly or fidgety/restless 2 0 1 0 0  Suicidal thoughts 0 0 0 0 0  PHQ-9 Score 19 13 14 7 12   Difficult doing work/chores  Somewhat difficult Somewhat difficult Not difficult at all Somewhat difficult      03/20/2022    1:34 PM 07/15/2021    2:30 PM 01/09/2020    2:01 PM 01/09/2020   11:22 AM  GAD 7 : Generalized Anxiety Score  Nervous, Anxious, on Edge 3 3 0 0  Control/stop worrying 3 3 0 0  Worry too much - different things 3 3 0 0  Trouble relaxing 3 3 0 0  Restless 3 3 0 0  Easily annoyed or irritable 3 3 1  0  Afraid - awful might happen  3 0 0  Total GAD 7 Score  21 1 0  Anxiety Difficulty  Somewhat difficult Not difficult at all Not difficult at all      Past Medical History:  Diagnosis Date   Hypertension    Irritable bowel syndrome (IBS)    OSA (obstructive sleep apnea)    Vitamin D deficiency    Social History   Socioeconomic  History   Marital status: Single    Spouse name: Not on file   Number of children: 2   Years of education: Not on file   Highest education level: Not on file  Occupational History   Occupation: school bus driver  Tobacco Use   Smoking status: Never   Smokeless tobacco: Never  Vaping Use   Vaping Use: Never used  Substance and Sexual Activity   Alcohol use: Never   Drug use: Never   Sexual activity: Not on file  Other Topics Concern   Not on file  Social History Narrative   Not on file   Social Determinants of Health   Financial Resource Strain: Not on file  Food Insecurity: Not on file  Transportation Needs: Not on file  Physical Activity: Not on file  Stress: Not on file  Social Connections: Not on file  Intimate Partner Violence: Not on file   Family History  Problem Relation Age of Onset   Hypertension Mother    Heart attack Mother    Kidney disease Father    Diabetes Father    No Known  Allergies    Review of Systems  Constitutional: Negative.   HENT: Negative.    Eyes: Negative.   Respiratory:  Positive for shortness of breath. Negative for wheezing.   Cardiovascular:  Positive for leg swelling. Negative for chest pain and palpitations.  Gastrointestinal: Negative.   Genitourinary: Negative.   Musculoskeletal: Negative.   Skin: Negative.   Neurological: Negative.   Endo/Heme/Allergies: Negative.      Objective:     BP 120/81 (BP Location: Right Arm, Patient Position: Sitting, Cuff Size: Normal)   Pulse 61   Temp 98.2 F (36.8 C) (Oral)   Resp 18   Ht 5\' 6"  (1.676 m)   Wt 209 lb (94.8 kg)   SpO2 98%   BMI 33.73 kg/m  BP Readings from Last 3 Encounters:  03/20/22 120/81  10/31/21 124/82  10/16/21 (!) 142/91   Wt Readings from Last 3 Encounters:  03/20/22 209 lb (94.8 kg)  10/31/21 199 lb (90.3 kg)  07/15/21 197 lb (89.4 kg)      Physical Exam Vitals and nursing note reviewed.  Constitutional:      Appearance: Normal appearance.  HENT:     Head: Normocephalic and atraumatic.     Right Ear: External ear normal.     Left Ear: External ear normal.     Nose: Nose normal.     Mouth/Throat:     Mouth: Mucous membranes are moist.     Pharynx: Oropharynx is clear.  Eyes:     Extraocular Movements: Extraocular movements intact.     Conjunctiva/sclera: Conjunctivae normal.     Pupils: Pupils are equal, round, and reactive to light.  Cardiovascular:     Rate and Rhythm: Normal rate and regular rhythm.     Pulses: Normal pulses.          Dorsalis pedis pulses are 2+ on the right side and 2+ on the left side.       Posterior tibial pulses are 2+ on the right side and 2+ on the left side.     Heart sounds: Normal heart sounds.  Pulmonary:     Effort: Pulmonary effort is normal.     Breath sounds: Normal breath sounds. No wheezing.  Musculoskeletal:        General: Normal range of motion.     Cervical back: Normal range of motion and neck supple.  Right lower leg: 1+ Pitting Edema present.     Left lower leg: 1+ Pitting Edema present.  Skin:    General: Skin is warm.  Neurological:     Mental Status: She is alert.  Psychiatric:        Attention and Perception: Attention normal.        Mood and Affect: Mood is anxious.        Speech: Speech normal.        Behavior: Behavior normal.        Thought Content: Thought content does not include homicidal or suicidal ideation.        Cognition and Memory: Cognition and memory normal.        Judgment: Judgment normal.       Assessment & Plan:   Problem List Items Addressed This Visit       Cardiovascular and Mediastinum   Essential hypertension     Respiratory   OSA (obstructive sleep apnea)   Relevant Orders   Cpap titration     Other   Obesity   Vitamin D deficiency   Relevant Orders   Vitamin D, 25-hydroxy   Other Visit Diagnoses     Localized edema    -  Primary   Relevant Orders   CBC with Differential/Platelet   Comp. Metabolic Panel (12)   TSH   Brain natriuretic peptide   Anxiety and depression       History of mononucleosis       Relevant Orders   Mononucleosis Test, Qual W/ Reflex   Prediabetes       Relevant Orders   HgB A1c (Completed)     1. Localized edema Patient is currently taking 50 mg of HCTZ on a daily basis, patient education given on supportive care red flags for prompt reevaluation.  Patient will follow-up with the mobile unit in 1 week, this provider stated that we would call her with her lab results and let her know where the mobile unit will be located for her to follow-up to continue addressing today's concerns.   - CBC with Differential/Platelet - Comp. Metabolic Panel (12) - TSH - Brain natriuretic peptide  2. Essential hypertension Continue current regimen, no refill needed today  3. Anxiety and depression We will continue to address patient's anxiety and depression at next week's office visit.  4. Vitamin D  deficiency  - Vitamin D, 25-hydroxy  5. History of mononucleosis  - Mononucleosis Test, Qual W/ Reflex  6. Prediabetes A1C 5.6; well-controlled  7. OSA (obstructive sleep apnea) Referral sent for CPAP titration  8. Class 1 obesity due to excess calories with body mass index (BMI) of 33.0 to 33.9 in adult, unspecified whether serious comorbidity present    I have reviewed the patient's medical history (PMH, PSH, Social History, Family History, Medications, and allergies) , and have been updated if relevant. I spent 30 minutes reviewing chart and  face to face time with patient.     Return in about 1 week (around 03/27/2022) for with [facility].    Kasandra Knudsenari S Mayers, PA-C

## 2022-03-20 NOTE — Patient Instructions (Signed)
We will call you with today's lab results.  We will let you know where we will be next week for you to follow-up with [place].  I encourage you to drink plenty of water, lower your sodium intake, keep your feet elevated when able and compression stockings might also offer some relief.  Roney Jaffe, PA-C Physician Assistant [facility] [email or web address]   Edema  Edema is an abnormal buildup of fluids in the body tissues and under the skin. Swelling of the legs, feet, and ankles is a common symptom that becomes more likely as you get older. Swelling is also common in looser tissues, such as around the eyes. Pressing on the area may make a temporary dent in your skin (pitting edema). This fluid may also accumulate in your lungs (pulmonary edema). There are many possible causes of edema. Eating too much salt (sodium) and being on your feet or sitting for a long time can cause edema in your legs, feet, and ankles. Common causes of edema include: Certain medical conditions, such as heart failure, liver or kidney disease, and cancer. Weak leg blood vessels. An injury. Pregnancy. Medicines. Being obese. Low protein levels in the blood. Hot weather may make edema worse. Edema is usually painless. Your skin may look swollen or shiny. Follow these instructions at home: Medicines Take over-the-counter and prescription medicines only as told by your health care provider. Your health care provider may prescribe a medicine to help your body get rid of extra water (diuretic). Take this medicine if you are told to take it. Eating and drinking Eat a low-salt (low-sodium) diet to reduce fluid as told by your health care provider. Sometimes, eating less salt may reduce swelling. Depending on the cause of your swelling, you may need to limit how much fluid you drink (fluid restriction). General instructions Raise (elevate) the injured area above the  level of your heart while you are sitting or lying down. Do not sit still or stand for long periods of time. Do not wear tight clothing. Do not wear garters on your upper legs. Exercise your legs to get your circulation going. This helps to move the fluid back into your blood vessels, and it may help the swelling go down. Wear compression stockings as told by your health care provider. These stockings help to prevent blood clots and reduce swelling in your legs. It is important that these are the correct size. These stockings should be prescribed by your health care provider to prevent possible injuries. If elastic bandages or wraps are recommended, use them as told by your health care provider. Contact a health care provider if: Your edema does not get better with treatment. You have heart, liver, or kidney disease and have symptoms of edema. You have sudden and unexplained weight gain. Get help right away if: You develop shortness of breath or chest pain. You cannot breathe when you lie down. You develop pain, redness, or warmth in the swollen areas. You have heart, liver, or kidney disease and suddenly get edema. You have a fever and your symptoms suddenly get worse. These symptoms may be an emergency. Get help right away. Call 911. Do not wait to see if the symptoms will go away. Do not drive yourself to the hospital. Summary Edema is an abnormal buildup of fluids in the body tissues and under the skin. Eating too much salt (sodium)and being on your feet or sitting for a long time can cause edema in your legs, feet, and  ankles. Raise (elevate) the injured area above the level of your heart while you are sitting or lying down. Follow your health care provider's instructions about diet and how much fluid you can drink. This information is not intended to replace advice given to you by your health care provider. Make sure you discuss any questions you have with your health care  provider. Document Revised: 06/10/2021 Document Reviewed: 06/10/2021 Elsevier Patient Education  2023 [employer].

## 2022-03-20 NOTE — Progress Notes (Signed)
Patient has eaten today. Patient has not taken medication today. Patient reports intermittent leg swelling over the past 2 weeks. Swelling presents throughout the day while walking and subsides in the night while patient is sleeping.

## 2022-03-21 ENCOUNTER — Telehealth: Payer: Self-pay | Admitting: Family

## 2022-03-21 LAB — CBC WITH DIFFERENTIAL/PLATELET
Basophils Absolute: 0 10*3/uL (ref 0.0–0.2)
Basos: 0 %
EOS (ABSOLUTE): 0.1 10*3/uL (ref 0.0–0.4)
Eos: 3 %
Hematocrit: 38.4 % (ref 34.0–46.6)
Hemoglobin: 12.4 g/dL (ref 11.1–15.9)
Immature Grans (Abs): 0 10*3/uL (ref 0.0–0.1)
Immature Granulocytes: 0 %
Lymphocytes Absolute: 1.7 10*3/uL (ref 0.7–3.1)
Lymphs: 35 %
MCH: 29.3 pg (ref 26.6–33.0)
MCHC: 32.3 g/dL (ref 31.5–35.7)
MCV: 91 fL (ref 79–97)
Monocytes Absolute: 0.4 10*3/uL (ref 0.1–0.9)
Monocytes: 9 %
Neutrophils Absolute: 2.6 10*3/uL (ref 1.4–7.0)
Neutrophils: 53 %
Platelets: 317 10*3/uL (ref 150–450)
RBC: 4.23 x10E6/uL (ref 3.77–5.28)
RDW: 13.9 % (ref 11.7–15.4)
WBC: 4.8 10*3/uL (ref 3.4–10.8)

## 2022-03-21 LAB — COMP. METABOLIC PANEL (12)
AST: 17 IU/L (ref 0–40)
Albumin/Globulin Ratio: 1.4 (ref 1.2–2.2)
Albumin: 4.7 g/dL (ref 3.8–4.8)
Alkaline Phosphatase: 80 IU/L (ref 44–121)
BUN/Creatinine Ratio: 11 (ref 9–23)
BUN: 10 mg/dL (ref 6–24)
Bilirubin Total: 0.5 mg/dL (ref 0.0–1.2)
Calcium: 10.1 mg/dL (ref 8.7–10.2)
Chloride: 101 mmol/L (ref 96–106)
Creatinine, Ser: 0.92 mg/dL (ref 0.57–1.00)
Globulin, Total: 3.3 g/dL (ref 1.5–4.5)
Glucose: 92 mg/dL (ref 70–99)
Potassium: 3.8 mmol/L (ref 3.5–5.2)
Sodium: 141 mmol/L (ref 134–144)
Total Protein: 8 g/dL (ref 6.0–8.5)
eGFR: 78 mL/min/{1.73_m2} (ref >59)

## 2022-03-21 LAB — MONO QUAL W/RFLX QN: Mono Qual W/Rflx Qn: NEGATIVE

## 2022-03-21 LAB — TSH: TSH: 1.78 u[IU]/mL (ref 0.450–4.500)

## 2022-03-21 LAB — BRAIN NATRIURETIC PEPTIDE: BNP: 25.8 pg/mL (ref 0.0–100.0)

## 2022-03-21 LAB — VITAMIN D 25 HYDROXY (VIT D DEFICIENCY, FRACTURES): Vit D, 25-Hydroxy: 29.2 ng/mL — ABNORMAL LOW (ref 30.0–100.0)

## 2022-03-21 NOTE — Telephone Encounter (Signed)
Copied from CRM [id number]. Topic: General - Other >> Mar 21, 2022 11:25 AM Gaetana Michaelis A wrote: Reason for CRM: The patient would like to speak with their PCP via MyChart  The patient would like to continue discussing an ongoing health issue   The patient declined to elaborate but shares that their PCP is aware of the concern   Please contact further when possible

## 2022-03-24 ENCOUNTER — Telehealth: Payer: Self-pay

## 2022-03-24 NOTE — Telephone Encounter (Signed)
-----   Message from Roney Jaffe, [place] sent at 03/24/2022  2:43 PM EDT ----- Please call patient let her know that her testing for mononucleosis did return negative this time.  Her thyroid function, kidney and liver function are within normal limits.  She does not show signs of anemia.  Her marker that may suggest heart failure for the reason for her leg swelling was negative.  Her vitamin D levels are below normal limits.  She needs to take 2000 units once daily, she will purchase this over-the-counter.  Patient is to follow-up with the mobile unit this week for further evaluation.  Please direct her to our schedule.

## 2022-04-02 NOTE — Progress Notes (Signed)
Patient ID: Vanessa Burton, female    DOB: 07/20/1976  MRN: [id number]  CC: FMLA paperwork  Subjective: Vanessa Burton is a 46 y.o. female who presents for FMLA paperwork.   Her concerns today include:  Requesting FMLA paperwork completion for 12 weeks related to anxiety depression. 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 Taking Hydroxyzine at bedtime because makes her sleepy. Not ready to begin new medication. Ready for referral to therapist.     04/10/2022    9:27 AM 03/20/2022    1:34 PM 10/31/2021    8:30 AM 07/15/2021    2:30 PM 03/20/2021    9:32 AM  Depression screen PHQ 2/9  Decreased Interest 3 2 2 2 1   Down, Depressed, Hopeless 3 2 1 1 1   PHQ - 2 Score 6 4 3 3 2   Altered sleeping 3 3 3 3 1   Tired, decreased energy 3 3 3 3 1   Change in appetite 3 3 1 1 1   Feeling bad or failure about yourself  3 2 0 0 1  Trouble concentrating 3 2 3 3 1   Moving slowly or fidgety/restless 1 2 0 1 0  Suicidal thoughts 0 0 0 0 0  PHQ-9 Score 22 19 13 14 7   Difficult doing work/chores Extremely dIfficult  Somewhat difficult Somewhat difficult Not difficult at all    Patient Active Problem List   Diagnosis Date Noted   GERD (gastroesophageal reflux disease) 06/29/2018   Bilateral leg edema 04/01/2018   Obesity 04/01/2018   Vitamin D deficiency 04/01/2018   Anxiety 03/30/2018   Depression 03/30/2018   Essential hypertension 03/30/2018   Irritable bowel syndrome with constipation 03/30/2018   OSA (obstructive sleep apnea) 03/30/2018   Lower urinary tract symptoms (LUTS) 01/08/2017   Acne 01/06/2013     Current Outpatient Medications on File Prior to Visit  Medication Sig Dispense Refill   acetaminophen (TYLENOL) 500 MG tablet Take 2 tablets (1,000 mg total) by mouth every 8 (eight) hours as needed for up to 60 doses for mild pain or fever. 120 tablet 0   amLODipine (NORVASC) 5 MG tablet TAKE 1 TABLET BY MOUTH EVERY DAY 90 tablet 0    clotrimazole-betamethasone (LOTRISONE) cream Apply to affected area 2 times daily as needed 45 g 0   hydrochlorothiazide (HYDRODIURIL) 50 MG tablet TAKE 1 TABLET BY MOUTH EVERY DAY 90 tablet 0   hydrOXYzine (VISTARIL) 25 MG capsule Take 1 capsule (25 mg total) by mouth every 8 (eight) hours as needed. 30 capsule 2   ibuprofen (ADVIL) 600 MG tablet Take 1 tablet (600 mg total) by mouth every 8 (eight) hours as needed for up to 60 doses for fever, headache, mild pain or moderate pain (Inflammation). Take 1 tablet 3 times daily as needed for inflammation of upper airways and/or pain. 60 tablet 0   polyethylene glycol powder (GLYCOLAX/MIRALAX) 17 GM/SCOOP powder Take 17 g by mouth daily. 3350 g 0   No current facility-administered medications on file prior to visit.    No Known Allergies  Social History   Socioeconomic History   Marital status: Single    Spouse name: Not on file   Number of children: 2   Years of education: Not on file   Highest education level: Not on file  Occupational History   Occupation: school bus driver  Tobacco Use   Smoking status: Never    Passive exposure: Never   Smokeless tobacco: Never  Vaping Use   Vaping  Use: Never used  Substance and Sexual Activity   Alcohol use: Never   Drug use: Never   Sexual activity: Not on file  Other Topics Concern   Not on file  Social History Narrative   Not on file   Social Determinants of Health   Financial Resource Strain: Not on file  Food Insecurity: Not on file  Transportation Needs: Not on file  Physical Activity: Not on file  Stress: Not on file  Social Connections: Not on file  Intimate Partner Violence: Not on file    Family History  Problem Relation Age of Onset   Hypertension Mother    Heart attack Mother    Kidney disease Father    Diabetes Father     Past Surgical History:  Procedure Laterality Date   TUBAL LIGATION      ROS: Review of Systems Negative except as stated  above  PHYSICAL EXAM: BP 138/90 (BP Location: Left Arm, Patient Position: Sitting, Cuff Size: Large)   Pulse 78   Temp 98.3 F (36.8 C)   Resp 16   Ht 5' 5.98" (1.676 m)   Wt 205 lb (93 kg)   SpO2 98%   BMI 33.10 kg/m   Physical Exam HENT:     Head: Normocephalic and atraumatic.  Eyes:     Extraocular Movements: Extraocular movements intact.     Conjunctiva/sclera: Conjunctivae normal.     Pupils: Pupils are equal, round, and reactive to light.  Cardiovascular:     Rate and Rhythm: Normal rate and regular rhythm.     Pulses: Normal pulses.     Heart sounds: Normal heart sounds.  Pulmonary:     Effort: Pulmonary effort is normal.     Breath sounds: Normal breath sounds.  Musculoskeletal:     Cervical back: Normal range of motion and neck supple.  Neurological:     General: No focal deficit present.     Mental Status: She is alert and oriented to person, place, and time.  Psychiatric:        Mood and Affect: Mood normal.        Behavior: Behavior normal.     ASSESSMENT AND PLAN: 1. Encounter for completion of form with patient 2. Anxiety and depression - FMLA paperwork completed today in office.  - Patient denies thoughts of self-harm, suicidal ideations, homicidal ideations. - Continue Hydroxyzine as prescribed. No refills needed as of present.  - Per patient request referral to Psychiatry for further evaluation and management.  - Ambulatory referral to Psychiatry    Patient was given the opportunity to ask questions.  Patient verbalized understanding of the plan and was able to repeat key elements of the plan. Patient was given clear instructions to go to Emergency Department or return to medical center if symptoms don't improve, worsen, or new problems develop.The patient verbalized understanding.   Orders Placed This Encounter  Procedures   Ambulatory referral to Psychiatry    Follow-up with primary provider as scheduled.   Rema Fendt, NP

## 2022-04-08 ENCOUNTER — Other Ambulatory Visit: Payer: Self-pay | Admitting: Family

## 2022-04-08 DIAGNOSIS — G4733 Obstructive sleep apnea (adult) (pediatric): Secondary | ICD-10-CM

## 2022-04-10 ENCOUNTER — Encounter: Payer: Self-pay | Admitting: Family

## 2022-04-10 ENCOUNTER — Ambulatory Visit (INDEPENDENT_AMBULATORY_CARE_PROVIDER_SITE_OTHER): Payer: 59 | Admitting: Family

## 2022-04-10 VITALS — BP 138/90 | HR 78 | Temp 98.3°F | Resp 16 | Ht 65.98 in | Wt 205.0 lb

## 2022-04-10 DIAGNOSIS — Z0289 Encounter for other administrative examinations: Secondary | ICD-10-CM | POA: Diagnosis not present

## 2022-04-10 DIAGNOSIS — F419 Anxiety disorder, unspecified: Secondary | ICD-10-CM

## 2022-04-10 DIAGNOSIS — R69 Illness, unspecified: Secondary | ICD-10-CM | POA: Diagnosis not present

## 2022-04-10 DIAGNOSIS — F32A Depression, unspecified: Secondary | ICD-10-CM

## 2022-04-10 NOTE — Progress Notes (Signed)
Pt presents for completion of FMLA for anxiety

## 2022-04-14 ENCOUNTER — Other Ambulatory Visit: Payer: Self-pay | Admitting: Family

## 2022-04-14 DIAGNOSIS — I10 Essential (primary) hypertension: Secondary | ICD-10-CM

## 2022-04-19 NOTE — Progress Notes (Unsigned)
Virtual Visit via Telephone Note  I connected with Vanessa Burton, on 04/21/2022 at 10:33 AM by telephone and verified that I am speaking with the correct person using two identifiers.  Consent: I discussed the limitations, risks, security and privacy concerns of performing an evaluation and management service by telephone and the availability of in person appointments. I also discussed with the patient that there may be a patient responsible charge related to this service. The patient expressed understanding and agreed to proceed.   Location of Patient: Home  Location of Provider: [facility]   Persons participating in Telemedicine visit: Adrie Picking Ricky Stabs, NP Margorie John, CMA   History of Present Illness: Vanessa Burton is a 46 year-old female who presents for [facility] paperwork completion.  Anxiety depression follow-up: 04/10/2022: - Requesting FMLA paperwork completion for 12 weeks related to anxiety depression. 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 Taking Hydroxyzine at bedtime because makes her sleepy. Not ready to begin new medication. Ready for referral to therapist. - FMLA paperwork completed today in office.  - Patient denies thoughts of self-harm, suicidal ideations, homicidal ideations. - Continue Hydroxyzine as prescribed. No refills needed as of present.  - Per patient request referral to Psychiatry for further evaluation and management.  - Ambulatory referral to Psychiatry  04/21/2022: Reports employer needs additional paperwork completed from PCP. States she has a medical release form (dating back up to 2 years) her employer is requesting her to sign as well. Reports she will find out more about why her employer is requesting a signed medical release from her because she is concerned that she will be signing away her HIPAA rights. Has appointment scheduled 8/9 with therapist and 8/21 with  psychiatrist. Would like second psychiatry referral placed to see if she can get an earlier appointment if possible.   2. IBS: Constipation for 4 days. Tried laxative and helped some. No gas recently. Intermittent nausea.    Past Medical History:  Diagnosis Date   Hypertension    Irritable bowel syndrome (IBS)    OSA (obstructive sleep apnea)    Vitamin D deficiency    No Known Allergies  Current Outpatient Medications on File Prior to Visit  Medication Sig Dispense Refill   acetaminophen (TYLENOL) 500 MG tablet Take 2 tablets (1,000 mg total) by mouth every 8 (eight) hours as needed for up to 60 doses for mild pain or fever. 120 tablet 0   amLODipine (NORVASC) 5 MG tablet TAKE 1 TABLET BY MOUTH EVERY DAY 90 tablet 0   clotrimazole-betamethasone (LOTRISONE) cream Apply to affected area 2 times daily as needed 45 g 0   hydrochlorothiazide (HYDRODIURIL) 50 MG tablet TAKE 1 TABLET BY MOUTH EVERY DAY 90 tablet 0   hydrOXYzine (VISTARIL) 25 MG capsule Take 1 capsule (25 mg total) by mouth every 8 (eight) hours as needed. 30 capsule 2   ibuprofen (ADVIL) 600 MG tablet Take 1 tablet (600 mg total) by mouth every 8 (eight) hours as needed for up to 60 doses for fever, headache, mild pain or moderate pain (Inflammation). Take 1 tablet 3 times daily as needed for inflammation of upper airways and/or pain. 60 tablet 0   polyethylene glycol powder (GLYCOLAX/MIRALAX) 17 GM/SCOOP powder Take 17 g by mouth daily. 3350 g 0   No current facility-administered medications on file prior to visit.    Observations/Objective: Alert and oriented x 3. Not in acute distress. Physical examination not completed as this  is a telemedicine visit.  Assessment and Plan: 1. Encounter for completion of form with patient 2. Anxiety and depression - Patient denies thoughts of self-harm, suicidal ideations, homicidal ideations. - Mental Health Statement from patient's employer, [employer], completed  today in office.  - We will need confirmation of patient's agreement/signed medical release statement prior to CMA faxing over completed paperwork.   - Referral to Psychiatry for further evaluation and management. Goal of getting patient sooner appointment than current August 2023 appointment. - Encouraged to keep already scheduled appointments with Psychiatry/therapist August 2023 until earlier appointment confirmed. Patient agreeable. - Ambulatory referral to Psychiatry  3. Irritable bowel syndrome with constipation - Docusate sodium as prescribed. Counseled on medication adherence and adverse effects.  - Prune juice may help. - Referral to Gastroenterology for further evaluation and management. - docusate sodium (COLACE) 50 MG capsule; Take 1 capsule (50 mg total) by mouth 2 (two) times daily.  Dispense: 60 capsule; Refill: 1 - Ambulatory referral to Gastroenterology  4. Colon cancer screening - Referral to Gastroenterology for colon cancer screening by colonoscopy. - Ambulatory referral to Gastroenterology   Follow Up Instructions: Referral to Psychiatry. Follow-up with PCP as scheduled.   Patient was given clear instructions to go to Emergency Department or return to medical center if symptoms don't improve, worsen, or new problems develop.The patient verbalized understanding.  I discussed the assessment and treatment plan with the patient. The patient was provided an opportunity to ask questions and all were answered. The patient agreed with the plan and demonstrated an understanding of the instructions.   The patient was advised to call back or seek an in-person evaluation if the symptoms worsen or if the condition fails to improve as anticipated.     I provided 20 minutes total of non-face-to-face time during this encounter.   Rema Fendt, NP  [facility] at [facility] [place], [place] [phone] 04/21/2022, 10:33 AM

## 2022-04-21 ENCOUNTER — Ambulatory Visit (INDEPENDENT_AMBULATORY_CARE_PROVIDER_SITE_OTHER): Payer: 59 | Admitting: Family

## 2022-04-21 DIAGNOSIS — K581 Irritable bowel syndrome with constipation: Secondary | ICD-10-CM

## 2022-04-21 DIAGNOSIS — Z1211 Encounter for screening for malignant neoplasm of colon: Secondary | ICD-10-CM

## 2022-04-21 DIAGNOSIS — F419 Anxiety disorder, unspecified: Secondary | ICD-10-CM | POA: Diagnosis not present

## 2022-04-21 DIAGNOSIS — Z0289 Encounter for other administrative examinations: Secondary | ICD-10-CM

## 2022-04-21 DIAGNOSIS — F32A Depression, unspecified: Secondary | ICD-10-CM | POA: Diagnosis not present

## 2022-04-21 DIAGNOSIS — R69 Illness, unspecified: Secondary | ICD-10-CM | POA: Diagnosis not present

## 2022-04-21 MED ORDER — DOCUSATE SODIUM 50 MG PO CAPS: 50.0000 mg | ORAL_CAPSULE | Freq: Two times a day (BID) | ORAL | 1 refills | Status: AC

## 2022-05-05 ENCOUNTER — Telehealth: Payer: Self-pay

## 2022-05-05 NOTE — Progress Notes (Deleted)
Patient ID: Vanessa Burton, female    DOB: 24-Feb-1976  MRN: [id number]  CC: No chief complaint on file.   Subjective: Vanessa Burton is a 46 y.o. female who presents for  Her concerns today include:   Unsure today's CC. Schedule notes only say f/u  Patient Active Problem List   Diagnosis Date Noted   GERD (gastroesophageal reflux disease) 06/29/2018   Bilateral leg edema 04/01/2018   Obesity 04/01/2018   Vitamin D deficiency 04/01/2018   Anxiety 03/30/2018   Depression 03/30/2018   Essential hypertension 03/30/2018   Irritable bowel syndrome with constipation 03/30/2018   OSA (obstructive sleep apnea) 03/30/2018   Lower urinary tract symptoms (LUTS) 01/08/2017   Acne 01/06/2013     Current Outpatient Medications on File Prior to Visit  Medication Sig Dispense Refill   acetaminophen (TYLENOL) 500 MG tablet Take 2 tablets (1,000 mg total) by mouth every 8 (eight) hours as needed for up to 60 doses for mild pain or fever. 120 tablet 0   amLODipine (NORVASC) 5 MG tablet TAKE 1 TABLET BY MOUTH EVERY DAY 90 tablet 0   clotrimazole-betamethasone (LOTRISONE) cream Apply to affected area 2 times daily as needed 45 g 0   docusate sodium (COLACE) 50 MG capsule Take 1 capsule (50 mg total) by mouth 2 (two) times daily. 60 capsule 1   hydrochlorothiazide (HYDRODIURIL) 50 MG tablet TAKE 1 TABLET BY MOUTH EVERY DAY 90 tablet 0   hydrOXYzine (VISTARIL) 25 MG capsule Take 1 capsule (25 mg total) by mouth every 8 (eight) hours as needed. 30 capsule 2   ibuprofen (ADVIL) 600 MG tablet Take 1 tablet (600 mg total) by mouth every 8 (eight) hours as needed for up to 60 doses for fever, headache, mild pain or moderate pain (Inflammation). Take 1 tablet 3 times daily as needed for inflammation of upper airways and/or pain. 60 tablet 0   polyethylene glycol powder (GLYCOLAX/MIRALAX) 17 GM/SCOOP powder Take 17 g by mouth daily. 3350 g 0   No current facility-administered medications on file prior  to visit.    No Known Allergies  Social History   Socioeconomic History   Marital status: Single    Spouse name: Not on file   Number of children: 2   Years of education: Not on file   Highest education level: Not on file  Occupational History   Occupation: school bus driver  Tobacco Use   Smoking status: Never    Passive exposure: Never   Smokeless tobacco: Never  Vaping Use   Vaping Use: Never used  Substance and Sexual Activity   Alcohol use: Never   Drug use: Never   Sexual activity: Not on file  Other Topics Concern   Not on file  Social History Narrative   Not on file   Social Determinants of Health   Financial Resource Strain: Not on file  Food Insecurity: Not on file  Transportation Needs: Not on file  Physical Activity: Not on file  Stress: Not on file  Social Connections: Not on file  Intimate Partner Violence: Not on file    Family History  Problem Relation Age of Onset   Hypertension Mother    Heart attack Mother    Kidney disease Father    Diabetes Father     Past Surgical History:  Procedure Laterality Date   TUBAL LIGATION      ROS: Review of Systems Negative except as stated above  PHYSICAL EXAM: There were no vitals taken for  this visit.  Physical Exam  {female adult master:310786} {female adult master:310785}     Latest Ref Rng & Units 03/20/2022    2:06 PM 01/04/2021    2:52 PM 10/23/2020    8:53 AM  CMP  Glucose 70 - 99 mg/dL 92   94   BUN 6 - 24 mg/dL 10   11   Creatinine 0.45 - 1.00 mg/dL 4.09   8.11   Sodium 914 - 144 mmol/L 141   137   Potassium 3.5 - 5.2 mmol/L 3.8   3.9   Chloride 96 - 106 mmol/L 101   104   CO2 22 - 32 mmol/L   25   Calcium 8.7 - 10.2 mg/dL 78.2   9.2   Total Protein 6.0 - 8.5 g/dL 8.0  8.5    Total Bilirubin 0.0 - 1.2 mg/dL 0.5  0.6    Alkaline Phos 44 - 121 IU/L 80  92    AST 0 - 40 IU/L 17  21    ALT 0 - 32 IU/L  13     Lipid Panel     Component Value Date/Time   CHOL 185 01/04/2021 1452    TRIG 43 01/04/2021 1452   HDL 47 01/04/2021 1452   CHOLHDL 3.9 01/04/2021 1452   LDLCALC 130 (H) 01/04/2021 1452    CBC    Component Value Date/Time   WBC 4.8 03/20/2022 1406   WBC 4.9 10/23/2020 0853   RBC 4.23 03/20/2022 1406   RBC 3.97 10/23/2020 0853   HGB 12.4 03/20/2022 1406   HCT 38.4 03/20/2022 1406   PLT 317 03/20/2022 1406   MCV 91 03/20/2022 1406   MCH 29.3 03/20/2022 1406   MCH 29.2 10/23/2020 0853   MCHC 32.3 03/20/2022 1406   MCHC 31.4 10/23/2020 0853   RDW 13.9 03/20/2022 1406   LYMPHSABS 1.7 03/20/2022 1406   MONOABS 0.5 10/23/2020 0853   EOSABS 0.1 03/20/2022 1406   BASOSABS 0.0 03/20/2022 1406    ASSESSMENT AND PLAN:  There are no diagnoses linked to this encounter.   Patient was given the opportunity to ask questions.  Patient verbalized understanding of the plan and was able to repeat key elements of the plan. Patient was given clear instructions to go to Emergency Department or return to medical center if symptoms don't improve, worsen, or new problems develop.The patient verbalized understanding.   No orders of the defined types were placed in this encounter.    Requested Prescriptions    No prescriptions requested or ordered in this encounter    No follow-ups on file.  Rema Fendt, NP

## 2022-05-05 NOTE — Telephone Encounter (Signed)
Fax from [place] has been received but due to nature of paperwork pt needs to be referred to Psychiatry before provider can complete

## 2022-05-07 ENCOUNTER — Ambulatory Visit (HOSPITAL_COMMUNITY): Payer: Self-pay | Admitting: Licensed Clinical Social Worker

## 2022-05-12 ENCOUNTER — Other Ambulatory Visit: Payer: Self-pay | Admitting: Family

## 2022-05-12 DIAGNOSIS — I10 Essential (primary) hypertension: Secondary | ICD-10-CM

## 2022-05-14 ENCOUNTER — Ambulatory Visit: Payer: 59 | Admitting: Family

## 2022-05-14 ENCOUNTER — Ambulatory Visit (INDEPENDENT_AMBULATORY_CARE_PROVIDER_SITE_OTHER): Payer: 59 | Admitting: Family

## 2022-05-14 DIAGNOSIS — R69 Illness, unspecified: Secondary | ICD-10-CM | POA: Diagnosis not present

## 2022-05-14 DIAGNOSIS — I10 Essential (primary) hypertension: Secondary | ICD-10-CM

## 2022-05-14 DIAGNOSIS — F419 Anxiety disorder, unspecified: Secondary | ICD-10-CM

## 2022-05-14 DIAGNOSIS — F32A Depression, unspecified: Secondary | ICD-10-CM

## 2022-05-14 MED ORDER — HYDROXYZINE PAMOATE 25 MG PO CAPS: 25.0000 mg | ORAL_CAPSULE | Freq: Three times a day (TID) | ORAL | 2 refills | Status: AC | PRN

## 2022-05-14 MED ORDER — HYDROCHLOROTHIAZIDE 50 MG PO TABS: 50.0000 mg | ORAL_TABLET | Freq: Every day | ORAL | 0 refills | Status: DC

## 2022-05-14 MED ORDER — AMLODIPINE BESYLATE 5 MG PO TABS: 5.0000 mg | ORAL_TABLET | Freq: Every day | ORAL | 0 refills | Status: DC

## 2022-05-14 MED ORDER — SERTRALINE HCL 25 MG PO TABS: 25.0000 mg | ORAL_TABLET | Freq: Every day | ORAL | 2 refills | Status: AC

## 2022-05-14 NOTE — Progress Notes (Signed)
Pt presents to speak to provider about returning to work

## 2022-05-14 NOTE — Progress Notes (Signed)
Virtual Visit via Telephone Note  I connected with Vanessa Burton, on 05/14/2022 at 8:56 AM by telephone and verified that I am speaking with the correct person using two identifiers.  Consent: I discussed the limitations, risks, security and privacy concerns of performing an evaluation and management service by telephone and the availability of in person appointments. I also discussed with the patient that there may be a patient responsible charge related to this service. The patient expressed understanding and agreed to proceed.   Location of Patient: Home  Location of Provider: [facility]   Persons participating in Telemedicine visit: Layni Kreamer Ricky Stabs, NP Margorie John, CMA   History of Present Illness: Vanessa Burton is a 46 year-old female who presents for medication management.   - Doesn't feel Hydroxyzine working as much as expected for anxiety depression and makes feel drowsy the next day. She is ready to add a new anxiety depression medication to see if it helps with anxiety depression management. Plans to keep appointment scheduled 05/29/2022 with Psychiatry.  - Reports ready to return to work on 05/26/2022. Reports she is not feeling at her best but needs to return to work for financial-related concerns. States she has a paper that needs to be completed by PCP prior to returning to work. She plans to drop paper off for completion by the end of this week.  - Doing well on blood pressure medications without issues/concerns.     Past Medical History:  Diagnosis Date   Hypertension    Irritable bowel syndrome (IBS)    OSA (obstructive sleep apnea)    Vitamin D deficiency    No Known Allergies  Current Outpatient Medications on File Prior to Visit  Medication Sig Dispense Refill   acetaminophen (TYLENOL) 500 MG tablet Take 2 tablets (1,000 mg total) by mouth every 8 (eight) hours as needed for up to 60 doses for mild pain or  fever. 120 tablet 0   amLODipine (NORVASC) 5 MG tablet TAKE 1 TABLET BY MOUTH EVERY DAY 90 tablet 0   clotrimazole-betamethasone (LOTRISONE) cream Apply to affected area 2 times daily as needed 45 g 0   docusate sodium (COLACE) 50 MG capsule Take 1 capsule (50 mg total) by mouth 2 (two) times daily. 60 capsule 1   hydrochlorothiazide (HYDRODIURIL) 50 MG tablet TAKE 1 TABLET BY MOUTH EVERY DAY 90 tablet 0   hydrOXYzine (VISTARIL) 25 MG capsule Take 1 capsule (25 mg total) by mouth every 8 (eight) hours as needed. 30 capsule 2   ibuprofen (ADVIL) 600 MG tablet Take 1 tablet (600 mg total) by mouth every 8 (eight) hours as needed for up to 60 doses for fever, headache, mild pain or moderate pain (Inflammation). Take 1 tablet 3 times daily as needed for inflammation of upper airways and/or pain. 60 tablet 0   polyethylene glycol powder (GLYCOLAX/MIRALAX) 17 GM/SCOOP powder Take 17 g by mouth daily. 3350 g 0   No current facility-administered medications on file prior to visit.    Observations/Objective: Alert and oriented x 3. Not in acute distress. Physical examination not completed as this is a telemedicine visit.  Assessment and Plan: 1. Anxiety and depression - Patient denies thoughts of self-harm, suicidal ideations, homicidal ideations. - Continue Hydroxyzine as prescribed.  - Begin Sertraline as prescribed. Counseled on medication adherence and adverse effects.  - Patient reports ready to return to work on 05/26/2022. Reports she has a document that needs to be completed by PCP  prior to returning to work. She plans to bring document to our office for completion by the end of this week. Patient aware that return to work letter will be completed at the time of document review. Patient verbalized understanding. - Discussed that previous Mental Health Statement completed during 04/21/2022 appointment was returned to our office by [employer] [facility] requesting additional information for  Short-Term Disability. Discussed with patient that the requested information from her employer will need to be completed by Psychiatry. Patient verbalized understanding.  - Keep appointments scheduled 05/29/2022 and 06/19/2022 with Psychiatry.  - Follow-up with primary provider as scheduled.  - hydrOXYzine (VISTARIL) 25 MG capsule; Take 1 capsule (25 mg total) by mouth every 8 (eight) hours as needed.  Dispense: 30 capsule; Refill: 2 - sertraline (ZOLOFT) 25 MG tablet; Take 1 tablet (25 mg total) by mouth daily.  Dispense: 30 tablet; Refill: 2  2. Essential hypertension - Continue Amlodipine and Hydrochlorothiazide as prescribed.  - Counseled on blood pressure goal of less than 130/80, low-sodium, DASH diet, medication compliance, and 150 minutes of moderate intensity exercise per week as tolerated. Counseled on medication adherence and adverse effects. - Follow-up with primary provider in 3 months or sooner if needed.  - amLODipine (NORVASC) 5 MG tablet; Take 1 tablet (5 mg total) by mouth daily.  Dispense: 90 tablet; Refill: 0 - hydrochlorothiazide (HYDRODIURIL) 50 MG tablet; Take 1 tablet (50 mg total) by mouth daily.  Dispense: 90 tablet; Refill: 0   Follow Up Instructions: Follow-up with primary provider in 3 months or sooner if needed.    Patient was given clear instructions to go to Emergency Department or return to medical center if symptoms don't improve, worsen, or new problems develop.The patient verbalized understanding.  I discussed the assessment and treatment plan with the patient. The patient was provided an opportunity to ask questions and all were answered. The patient agreed with the plan and demonstrated an understanding of the instructions.   The patient was advised to call back or seek an in-person evaluation if the symptoms worsen or if the condition fails to improve as anticipated.    I provided 8 minutes total of non-face-to-face time during this encounter.   Rema Fendt, NP  [facility] at [facility] [place], [place] [phone] 05/14/2022, 8:56 AM

## 2022-05-20 ENCOUNTER — Telehealth: Payer: Self-pay | Admitting: Family

## 2022-05-20 NOTE — Telephone Encounter (Signed)
Pending completion from provider, will contact pt once completed

## 2022-05-20 NOTE — Telephone Encounter (Signed)
Pt dropping off doc for returning to work placed in provider bin.

## 2022-05-21 ENCOUNTER — Telehealth: Payer: Self-pay | Admitting: Family

## 2022-05-22 ENCOUNTER — Ambulatory Visit: Payer: 59 | Admitting: Family

## 2022-05-29 ENCOUNTER — Telehealth (HOSPITAL_COMMUNITY): Payer: Self-pay | Admitting: Licensed Clinical Social Worker

## 2022-05-29 ENCOUNTER — Ambulatory Visit (INDEPENDENT_AMBULATORY_CARE_PROVIDER_SITE_OTHER): Payer: Self-pay | Admitting: Licensed Clinical Social Worker

## 2022-05-29 DIAGNOSIS — Z91199 Patient's noncompliance with other medical treatment and regimen due to unspecified reason: Secondary | ICD-10-CM

## 2022-05-29 NOTE — Progress Notes (Signed)
LCSW counselor attempted to connect with patient for scheduled in office appointment at 9am. Pt did not show in the office for appointment. LCSW counselor called pt on phone and pt stated that she forgot about the appointment and did not want to do virtual option because her son was present.  Pt requested appointment be rescheduled to 9/20@9  in person.  Today's visit will be coded no show

## 2022-05-29 NOTE — Telephone Encounter (Signed)
LCSW counselor attempted to connect with patient for scheduled in office appointment at 9am. Pt did not show in the office for appointment. LCSW counselor called pt on phone and pt stated that she forgot about the appointment and did not want to do virtual option because her son was present.  Pt requested appointment be rescheduled to 9/20@9 in person.  Today's visit will be coded no show 

## 2022-06-18 ENCOUNTER — Other Ambulatory Visit: Payer: Self-pay | Admitting: Family

## 2022-06-18 ENCOUNTER — Telehealth: Payer: 59 | Admitting: Family Medicine

## 2022-06-18 DIAGNOSIS — I10 Essential (primary) hypertension: Secondary | ICD-10-CM

## 2022-06-18 DIAGNOSIS — B3731 Acute candidiasis of vulva and vagina: Secondary | ICD-10-CM | POA: Diagnosis not present

## 2022-06-18 MED ORDER — FLUCONAZOLE 150 MG PO TABS: 150.0000 mg | ORAL_TABLET | ORAL | 0 refills | Status: DC

## 2022-06-18 NOTE — Progress Notes (Signed)

## 2022-06-19 ENCOUNTER — Ambulatory Visit (HOSPITAL_COMMUNITY): Payer: Self-pay | Admitting: Psychiatry

## 2022-06-19 NOTE — Telephone Encounter (Signed)
last RF 05/14/22 #90 unable to RF due to end date being 08/12/22  Requested Prescriptions  Refused Prescriptions Disp Refills  . amLODipine (NORVASC) 5 MG tablet [Pharmacy Med Name: AMLODIPINE BESYLATE 5 MG TAB] 90 tablet 0    Sig: TAKE 1 TABLET (5 MG TOTAL) BY MOUTH DAILY.     Cardiovascular: Calcium Channel Blockers 2 Failed - 06/18/2022  5:02 PM      Failed - Last BP in normal range    BP Readings from Last 1 Encounters:  04/10/22 138/90         Passed - Last Heart Rate in normal range    Pulse Readings from Last 1 Encounters:  04/10/22 78         Passed - Valid encounter within last 6 months    Recent Outpatient Visits          1 month ago Anxiety and depression   Primary Care at [facility], Amy J, NP   1 month ago Encounter for completion of form with patient   Primary Care at [facility], Amy J, NP   2 months ago Encounter for completion of form with patient   Primary Care at [facility], Amy J, NP   3 months ago Localized edema   Primary Care at [facility], Cari S, PA-C   7 months ago Anxiety and depression   Primary Care at [facility], Salomon Fick, NP

## 2022-07-05 NOTE — Progress Notes (Signed)
Virtual Visit via Telephone Note  I connected with Vanessa Burton, on 07/18/2022 at 11:13 AM by telephone and verified that I am speaking with the correct person using two identifiers.  Consent: I discussed the limitations, risks, security and privacy concerns of performing an evaluation and management service by telephone and the availability of in person appointments. I also discussed with the patient that there may be a patient responsible charge related to this service. The patient expressed understanding and agreed to proceed.   Location of Patient: Home  Location of Provider: [facility]   Persons participating in Telemedicine visit: Vanessa Burton Ricky Stabs, NP Vanessa Burton, CMA   History of Present Illness: Vanessa Burton is a 46 year-old female who presents for [facility] paperwork completion. Reports her employer, [employer], stated last FMLA paperwork completed 04/21/2022 did not include episodic flare-ups. She is requesting 2 episodic flare-ups monthly. Since last primary care appointment she established with [facility] on 07/09/2022 and reports session went well. She is scheduled for next appointment  with the same on 08/20/2022. Also, patient requests completion of return to work certification should her employer need an updated copy. Patient denies inability to complete any job-related duties. 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 She would like for paperwork today to reflect 25 work hours weekly to balance work-related stress. No further issues or concerns for today.     Past Medical History:  Diagnosis Date   Hypertension    Irritable bowel syndrome (IBS)    OSA (obstructive sleep apnea)    Vitamin D deficiency    No Known Allergies  Current Outpatient Medications on File Prior to Visit  Medication Sig Dispense Refill   acetaminophen  (TYLENOL) 500 MG tablet Take 2 tablets (1,000 mg total) by mouth every 8 (eight) hours as needed for up to 60 doses for mild pain or fever. 120 tablet 0   amLODipine (NORVASC) 5 MG tablet Take 1 tablet (5 mg total) by mouth daily. 90 tablet 0   clotrimazole-betamethasone (LOTRISONE) cream Apply to affected area 2 times daily as needed 45 g 0   docusate sodium (COLACE) 50 MG capsule Take 1 capsule (50 mg total) by mouth 2 (two) times daily. 60 capsule 1   fluconazole (DIFLUCAN) 150 MG tablet Take 1 tablet PO once. Repeat in 3 days if needed. 2 tablet 0   hydrochlorothiazide (HYDRODIURIL) 50 MG tablet Take 1 tablet (50 mg total) by mouth daily. 90 tablet 0   hydrOXYzine (VISTARIL) 25 MG capsule Take 1 capsule (25 mg total) by mouth every 8 (eight) hours as needed. 30 capsule 2   ibuprofen (ADVIL) 600 MG tablet Take 1 tablet (600 mg total) by mouth every 8 (eight) hours as needed for up to 60 doses for fever, headache, mild pain or moderate pain (Inflammation). Take 1 tablet 3 times daily as needed for inflammation of upper airways and/or pain. 60 tablet 0   polyethylene glycol powder (GLYCOLAX/MIRALAX) 17 GM/SCOOP powder Take 17 g by mouth daily. 3350 g 0   sertraline (ZOLOFT) 25 MG tablet Take 1 tablet (25 mg total) by mouth daily. 30 tablet 2   No current facility-administered medications on file prior to visit.    Observations/Objective: Alert and oriented x 3. Not in acute distress. Physical examination not completed as this is a telemedicine visit.  Assessment and Plan: 1. Encounter for completion of form  with patient 2. Major depressive disorder, remission status unspecified, unspecified whether recurrent 3. GAD (generalized anxiety disorder) - Family and Medical Leave Act form completed today in office.  - [employer] Return to Work Certification completed today in office.  - Keep all scheduled appointments at [facility]. Discussed with patient any subsequent form completion should be  completed by provider at [facility] to ensure that plan of care correlates to completed forms. Patient verbalized understanding and agreement.   Follow Up Instructions: Follow-up with primary provider as scheduled. Keep all scheduled appointments with [facility].    Patient was given clear instructions to go to Emergency Department or return to medical center if symptoms don't improve, worsen, or new problems develop.The patient verbalized understanding.  I discussed the assessment and treatment plan with the patient. The patient was provided an opportunity to ask questions and all were answered. The patient agreed with the plan and demonstrated an understanding of the instructions.   The patient was advised to call back or seek an in-person evaluation if the symptoms worsen or if the condition fails to improve as anticipated.    I provided 8 minutes total of non-face-to-face time during this encounter.   Camillia Herter, NP  [facility] at [place], [place] 07/18/2022, 11:13 AM

## 2022-07-08 ENCOUNTER — Telehealth: Payer: 59 | Admitting: Physician Assistant

## 2022-07-08 DIAGNOSIS — R3989 Other symptoms and signs involving the genitourinary system: Secondary | ICD-10-CM

## 2022-07-08 DIAGNOSIS — R300 Dysuria: Secondary | ICD-10-CM

## 2022-07-08 MED ORDER — FLUCONAZOLE 150 MG PO TABS: ORAL_TABLET | ORAL | 0 refills | Status: DC

## 2022-07-08 MED ORDER — CEPHALEXIN 500 MG PO CAPS: 500.0000 mg | ORAL_CAPSULE | Freq: Two times a day (BID) | ORAL | 0 refills | Status: AC

## 2022-07-08 NOTE — Progress Notes (Signed)
Because of ongoing issue despite treatment with diflucan (2 doses) a couple of weeks ago, and main symptoms currently being painful urination, I feel your condition warrants further evaluation and I recommend that you be seen in a face to face visit.   NOTE: There will be NO CHARGE for this eVisit   If you are having a true medical emergency please call 911.      For an urgent face to face visit, [place] has seven urgent care centers for your convenience:     [facility] [place] [facility] [place] Get Driving Directions [phone] [address] [place], [place] [address]    [facility]) Get Driving Directions [phone] [place], [place] [address]  [facility] [place] ([place]) Get Driving Directions [phone] [address] [place],  [place]  [address]  [facility] [place] [facility] at [address] [place] Get Driving Directions  [phone] [address] [place],  [place] [address]   [facility] Get Driving Directions [phone] [address] [place], [place], [place] [address]   [place] [facility] Get Driving Directions  [phone] [address] [place], [place] [address]    Urgent [facility] at [place] Get Driving Directions [phone] [address] [place], [place] [address]  Your MyChart E-visit questionnaire answers were reviewed by a board certified advanced clinical practitioner to complete your personal care plan based on your specific symptoms.  Thank you for using e-Visits.

## 2022-07-08 NOTE — Progress Notes (Signed)
E-Visit for Urinary Problems  We are sorry that you are not feeling well.  Here is how we plan to help!  Based on what you shared with me it looks like you most likely have a simple urinary tract infection.  A UTI (Urinary Tract Infection) is a bacterial infection of the bladder.  Most cases of urinary tract infections are simple to treat but a key part of your care is to encourage you to drink plenty of fluids and watch your symptoms carefully.  I have prescribed Keflex 500 mg twice a day for 7 days.  Your symptoms should gradually improve. Call us if the burning in your urine worsens, you develop worsening fever, back pain or pelvic pain or if your symptoms do not resolve after completing the antibiotic.  I have sent in Diflucan in case of any antibiotic-induced yeast but the Keflex is much less likely to cause this compared to other antibiotics (penicillins, etc)  Urinary tract infections can be prevented by drinking plenty of water to keep your body hydrated.  Also be sure when you wipe, wipe from front to back and don't hold it in!  If possible, empty your bladder every 4 hours.  HOME CARE Drink plenty of fluids Compete the full course of the antibiotics even if the symptoms resolve Remember, when you need to go.go. Holding in your urine can increase the likelihood of getting a UTI! GET HELP RIGHT AWAY IF: You cannot urinate You get a high fever Worsening back pain occurs You see blood in your urine You feel sick to your stomach or throw up You feel like you are going to pass out  MAKE SURE YOU  Understand these instructions. Will watch your condition. Will get help right away if you are not doing well or get worse.   Thank you for choosing an e-visit.  Your e-visit answers were reviewed by a board certified advanced clinical practitioner to complete your personal care plan. Depending upon the condition, your plan could have included both over the counter or prescription  medications.  Please review your pharmacy choice. Make sure the pharmacy is open so you can pick up prescription now. If there is a problem, you may contact your provider through [employer] and have the prescription routed to another pharmacy.  Your safety is important to [place]. If you have drug allergies check your prescription carefully.   For the next 24 hours you can use MyChart to ask questions about today's visit, request a non-urgent call back, or ask for a work or school excuse. You will get an email in the next two days asking about your experience. I hope that your e-visit has been valuable and will speed your recovery.

## 2022-07-08 NOTE — Progress Notes (Signed)
I have spent 5 minutes in review of e-visit questionnaire, review and updating patient chart, medical decision making and response to patient.   Aadi Bordner Cody Montrice Gracey, PA-C    

## 2022-07-09 ENCOUNTER — Ambulatory Visit (INDEPENDENT_AMBULATORY_CARE_PROVIDER_SITE_OTHER): Payer: 59 | Admitting: Licensed Clinical Social Worker

## 2022-07-09 ENCOUNTER — Encounter (HOSPITAL_COMMUNITY): Payer: Self-pay

## 2022-07-09 DIAGNOSIS — F411 Generalized anxiety disorder: Secondary | ICD-10-CM | POA: Diagnosis not present

## 2022-07-09 DIAGNOSIS — R69 Illness, unspecified: Secondary | ICD-10-CM | POA: Diagnosis not present

## 2022-07-09 DIAGNOSIS — F331 Major depressive disorder, recurrent, moderate: Secondary | ICD-10-CM

## 2022-07-09 NOTE — Progress Notes (Signed)
Comprehensive Clinical Assessment (CCA) Note  07/09/2022 Vanessa Burton [id number]  [place] in office visit for patient and LCSW clinician   Chief Complaint:  Chief Complaint  Patient presents with   Establish Care   Visit Diagnosis:  Encounter Diagnoses  Name Primary?   MDD (major depressive disorder), recurrent episode, moderate (HCC) Yes   Generalized anxiety disorder     CCA Screening, Triage and Referral (STR)  Patient Reported Information How did you hear about [place]? Primary Care  Referral name: Durene Fruits NP  Referral phone number: No data recorded  Whom do you see for routine medical problems? Primary Care  Practice/Facility Name: No data recorded Practice/Facility Phone Number: No data recorded Name of Contact: No data recorded Contact Number: No data recorded Contact Fax Number: No data recorded Prescriber Name: No data recorded Prescriber Address (if known): No data recorded  What Is the Reason for Your Visit/Call Today? Vanessa Burton Is a 46 year old female reporting to [facility] for establishment of outpatient psychotherapy services. Patient is not currently under the treatment of a psychiatrist at time of session. Durene Fruits, NP is her PCP and has been managing medication (Zoloft, hydroxyzine). Patient reports that she has had some limited counseling in the past. Patient denies any history of any inpatient psychiatric hospitalization.  Patient reports that she has experienced some fleeting SI with no plans or intent to follow through. Patient denies any homicidal ideation or perceptual disturbances. Patient denies any history of substance use. Patient reports that she currently resides with her 76 year old son. Patient report says she has another 24 year old son that is in the [employer]. Patient reports that her primary external stressor is work related stress, and worries about her sons. After reviewing notes in epic patient has  been on short term disability/FMLA due to her anxiety symptoms recently. Reports that she would like to work on anger management, stress management, and emotion regulation.  How Long Has This Been Causing You Problems? > than 6 months  What Do You Feel Would Help You the Most Today? Treatment for Depression or other mood problem   Have You Recently Been in Any Inpatient Treatment (Hospital/Detox/Crisis Center/28-Day Program)? No  Name/Location of Program/Hospital:No data recorded How Long Were You There? No data recorded When Were You Discharged? No data recorded  Have You Ever Received Services From [facility] Before? Yes  Who Do You See at [facility]? No data recorded  Have You Recently Had Any Thoughts About Hurting Yourself? Yes  Are You Planning to Commit Suicide/Harm Yourself At This time? No   Have you Recently Had Thoughts About [place]? No  Explanation: No data recorded  Have You Used Any Alcohol or Drugs in the Past 24 Hours? No  How Long Ago Did You Use Drugs or Alcohol? No data recorded What Did You Use and How Much? No data recorded  Do You Currently Have a Therapist/Psychiatrist? No  Name of Therapist/Psychiatrist: No data recorded  Have You Been Recently Discharged From Any Office Practice or Programs? No  Explanation of Discharge From Practice/Program: No data recorded    CCA Screening Triage Referral Assessment Type of Contact: Face-to-Face  Is this Initial or Reassessment? No data recorded Date Telepsych consult ordered in CHL:  No data recorded Time Telepsych consult ordered in CHL:  No data recorded  Patient Reported Information Reviewed? No data recorded Patient Left Without Being Seen? No data recorded Reason for Not Completing Assessment: No  data recorded  Collateral Involvement: EPIC review   Does Patient Have a Automotive engineer Guardian? No data recorded Name and Contact of Legal Guardian: No data recorded If Minor  and Not Living with Parent(s), Who has Custody? No data recorded Is CPS involved or ever been involved? Never  Is APS involved or ever been involved? Never   Patient Determined To Be At Risk for Harm To Self or Others Based on Review of Patient Reported Information or Presenting Complaint? No  Method: No data recorded Availability of Means: No data recorded Intent: No data recorded Notification Required: No data recorded Additional Information for Danger to Others Potential: No data recorded Additional Comments for Danger to Others Potential: No data recorded Are There Guns or Other Weapons in Your Home? No data recorded Types of Guns/Weapons: No data recorded Are These Weapons Safely Secured?                            No data recorded Who Could Verify You Are Able To Have These Secured: No data recorded Do You Have any Outstanding Charges, Pending Court Dates, Parole/Probation? No data recorded Contacted To Inform of Risk of Harm To Self or Others: No data recorded  Location of Assessment: Other (comment) [facility] [place])   Does Patient Present under Involuntary Commitment? No  IVC Papers Initial File Date: No data recorded  [place] of Residence: [place]   Patient Currently Receiving the Following Services: Medication Management   Determination of Need: Routine (7 days)   Options For Referral: Medication Management; Outpatient Therapy     CCA Biopsychosocial Intake/Chief Complaint:  depression, anxiety  Current Symptoms/Problems: racing thoughts, depression   Patient Reported Schizophrenia/Schizoaffective Diagnosis in Past: No   Strengths: good self awareness  Preferences: outpatient psychiatric supports  Abilities: commitment to therapy   Type of Services Patient Feels are Needed: medication management; outpatient psychotherapy   Initial Clinical Notes/Concerns: No data recorded  Mental Health Symptoms Depression:  Difficulty Concentrating; Sleep  (too much or little); Tearfulness; Hopelessness; Irritability; Increase/decrease in appetite; Change in energy/activity; Worthlessness   Duration of Depressive symptoms: Greater than two weeks   Mania:  Racing thoughts; Change in energy/activity; Irritability   Anxiety:   Worrying; Tension; Sleep; Restlessness; Irritability; Fatigue; Difficulty concentrating   Psychosis:  None   Duration of Psychotic symptoms: No data recorded  Trauma:  No data recorded  Obsessions:  Recurrent & persistent thoughts/impulses/images   Compulsions:  None   Inattention:  None   Hyperactivity/Impulsivity:  None   Oppositional/Defiant Behaviors:  None   Emotional Irregularity:  Mood lability   Other Mood/Personality Symptoms:  No data recorded   Mental Status Exam Appearance and self-care  Stature:  Average   Weight:  Average weight   Clothing:  Neat/clean   Grooming:  Normal   Cosmetic use:  None   Posture/gait:  Normal   Motor activity:  Not Remarkable   Sensorium  Attention:  Normal   Concentration:  Normal   Orientation:  X5   Recall/memory:  Normal   Affect and Mood  Affect:  Appropriate   Mood:  Depressed; Anxious   Relating  Eye contact:  Normal   Facial expression:  Depressed; Anxious   Attitude toward examiner:  Cooperative   Thought and Language  Speech flow: Clear and Coherent   Thought content:  Appropriate to Mood and Circumstances   Preoccupation:  None   Hallucinations:  None   Organization:  No data recorded  [employer] of Knowledge:  Good   Intelligence:  Average   Abstraction:  Normal   Judgement:  Good   Reality Testing:  Realistic   Insight:  Good   Decision Making:  Normal   Social Functioning  Social Maturity:  Responsible   Social Judgement:  Normal   Stress  Stressors:  Work   Coping Ability:  Human resources officer Deficits:  None   Supports:  Support needed     Religion: Religion/Spirituality Are  You A Religious Person?: Yes  Leisure/Recreation: Leisure / Recreation Do You Have Hobbies?: Yes Leisure and Hobbies: "i enjoy the beach"  Exercise/Diet: Exercise/Diet Do You Exercise?: Yes Have You Gained or Lost A Significant Amount of Weight in the Past Six Months?: No Do You Follow a Special Diet?: No Do You Have Any Trouble Sleeping?: Yes Explanation of Sleeping Difficulties: insomnia at times   CCA Employment/Education Employment/Work Situation: Employment / Work Situation Employment Situation: Employed Where is Patient Currently Employed?: Psychologist, prison and probation services Are You Satisfied With Your Job?: No (pt states it is tiring to hear complaints and do conflict resolution all day) Do You Work More Than One Job?: No Work Stressors: stress from customer complaints Patient's Job has Been Impacted by Current Illness: No Describe how Patient's Job has Been Impacted: yes--pt is currently on leave from work due to stress Has Patient ever Been in the [employer]?: No  Education: Education Is Patient Currently Attending School?: No Last Grade Completed: 12 Did Garment/textile technologist From [employer]?: Yes Did Theme park manager?: Yes What Type of College Degree Do you Have?: AAS Did You Attend Graduate School?: No Did You Have An Individualized Education Program (IIEP): No Did You Have Any Difficulty At School?: No Patient's Education Has Been Impacted by Current Illness: No   CCA Family/Childhood History Family and Relationship History:    Childhood History:  Childhood History By whom was/is the patient raised?: Both parents Additional childhood history information: Both parents deceased. Pt reports that she lost a brother in 2020. Pt feels she had a stable childhood Description of patient's relationship with caregiver when they were a child: good relationship Patient's description of current relationship with people who raised him/her: deceased. good relationship w/  sisters. Does patient have siblings?: Yes Number of Siblings: 2 Description of patient's current relationship with siblings: good relationship w/ siblings Did patient suffer any verbal/emotional/physical/sexual abuse as a child?: No Did patient suffer from severe childhood neglect?: No Has patient ever been sexually abused/assaulted/raped as an adolescent or adult?: No Was the patient ever a victim of a crime or a disaster?: No Witnessed domestic violence?: No Has patient been affected by domestic violence as an adult?: No  Child/Adolescent Assessment:     CCA Substance Use Alcohol/Drug Use: Alcohol / Drug Use Pain Medications: SEE MAR Prescriptions: SEE MAR Over the Counter: SEE MAR History of alcohol / drug use?: No history of alcohol / drug abuse Negative Consequences of Use:  (NONE) Withdrawal Symptoms: None   ASAM's:  Six Dimensions of Multidimensional Assessment  Dimension 1:  Acute Intoxication and/or Withdrawal Potential:   Dimension 1:  Description of individual's past and current experiences of substance use and withdrawal: NONE  Dimension 2:  Biomedical Conditions and Complications:      Dimension 3:  Emotional, Behavioral, or Cognitive Conditions and Complications:     Dimension 4:  Readiness to Change:     Dimension 5:  Relapse, Continued use, or  Continued Problem Potential:     Dimension 6:  Recovery/Living Environment:     ASAM Severity Score: ASAM's Severity Rating Score: 0  ASAM Recommended Level of Treatment: ASAM Recommended Level of Treatment: Level I Outpatient Treatment   Substance use Disorder (SUD) Substance Use Disorder (SUD)  Checklist Symptoms of Substance Use:  (NONE)  Recommendations for Services/Supports/Treatments: Recommendations for Services/Supports/Treatments Recommendations For Services/Supports/Treatments: Individual Therapy, Medication Management  DSM5 Diagnoses: Patient Active Problem List   Diagnosis Date Noted   GERD  (gastroesophageal reflux disease) 06/29/2018   Bilateral leg edema 04/01/2018   Obesity 04/01/2018   Vitamin D deficiency 04/01/2018   Anxiety 03/30/2018   Depression 03/30/2018   Essential hypertension 03/30/2018   Irritable bowel syndrome with constipation 03/30/2018   OSA (obstructive sleep apnea) 03/30/2018   Lower urinary tract symptoms (LUTS) 01/08/2017   Acne 01/06/2013    Patient Centered Plan: Patient is on the following Treatment Plan(s):  Anxiety and Depression   Referrals to Alternative Service(s): Referred to Alternative Service(s):   Place:   Date:   Time:    Referred to Alternative Service(s):   Place:   Date:   Time:    Referred to Alternative Service(s):   Place:   Date:   Time:    Referred to Alternative Service(s):   Place:   Date:   Time:      Collaboration of Care: Other n/a at time of session  Patient/Guardian was advised Release of Information must be obtained prior to any record release in order to collaborate their care with an outside provider. Patient/Guardian was advised if they have not already done so to contact the registration department to sign all necessary forms in order for us to release information regarding their care.   Consent: Patient/Guardian gives verbal consent for treatment and assignment of benefits for services provided during this visit. Patient/Guardian expressed understanding and agreed to proceed.   Ova Gillentine R Jontue Crumpacker, LCSW

## 2022-07-09 NOTE — Plan of Care (Signed)
  Problem: Depression Goal:  Decrease depressive symptoms and improve levels of effective functioning-pt reports a decrease in overall depression symptoms 3 out of 5 sessions documented.  Outcome: Initial Goal: Develop healthy thinking patterns and beliefs about self, others, and the world that lead to the alleviation and help prevent the relapse of depression per self report 3 out of 5 sessions documented.   Outcome: Initial   Problem: Anxiety  Goal: Reduce overall frequency, intensity, and duration of the anxiety so that daily functioning is not impaired per pt self report 3 out of 5 sessions documented.   Outcome: Initial Goal: Learn and implement coping skills that result in a reduction of anxiety and worry, and improve daily functioning per pt report 3 out of 5 sessions documented  Outcome: Initial    Developed/revised tx plan based on pt self reported input. Pt verbally agrees with treatment plan at time of session and consents to sign digitally.   

## 2022-07-17 ENCOUNTER — Telehealth: Payer: Self-pay | Admitting: Family

## 2022-07-17 NOTE — Telephone Encounter (Signed)
Pt dropped off documentation to be completed for following day's appt w/ PCP 07/18/22 FOR fmla, placed in provider's bin. Please advise and thank you.

## 2022-07-18 ENCOUNTER — Ambulatory Visit (INDEPENDENT_AMBULATORY_CARE_PROVIDER_SITE_OTHER): Payer: 59 | Admitting: Family

## 2022-07-18 DIAGNOSIS — R69 Illness, unspecified: Secondary | ICD-10-CM | POA: Diagnosis not present

## 2022-07-18 DIAGNOSIS — Z0289 Encounter for other administrative examinations: Secondary | ICD-10-CM

## 2022-07-18 DIAGNOSIS — F411 Generalized anxiety disorder: Secondary | ICD-10-CM | POA: Diagnosis not present

## 2022-07-18 DIAGNOSIS — F329 Major depressive disorder, single episode, unspecified: Secondary | ICD-10-CM

## 2022-07-21 ENCOUNTER — Telehealth: Payer: Self-pay | Admitting: Family

## 2022-07-21 ENCOUNTER — Telehealth: Payer: 59 | Admitting: Family

## 2022-07-21 NOTE — Telephone Encounter (Signed)
Attempted to reach pt to inquire if she still needed Fax confirmation from 05/21/22 that was ready for pick up regarding [employer] Return to Work Certification. No answer. Please advise if pt needs this or if no longer to discard for shredding.

## 2022-07-24 NOTE — Progress Notes (Signed)
Virtual Visit via Telephone Note  I connected with Vanessa Burton, on 07/25/2022 at 9:38 AM by telephone and verified that I am speaking with the correct person using two identifiers.  Consent: I discussed the limitations, risks, security and privacy concerns of performing an evaluation and management service by telephone and the availability of in person appointments. I also discussed with the patient that there may be a patient responsible charge related to this service. The patient expressed understanding and agreed to proceed.   Location of Patient: Home  Location of Provider: [facility] participating in Telemedicine visit: [place], NP Elmon Else, CMA  History of Present Illness: Vanessa Burton is a 46 year-old female who presents for FMLA update. Reports per employer's leave of absence specialist the FMLA completed on 07/18/2022 beginning date needs to be revised from 07/20/2022 to 05/26/2022 which is the date she recently returned to work. States she did not ask the leave of absence specialist if the end date 07/22/2023 needs to be revised to August 2024 but she will follow-up to confirm and would rather me leave the 07/22/2023 date for now. Patient states there are no other revisions needed on FMLA to her knowledge. States she thought previous [place] completed on 04/23/2022 would have covered August 2023 but leave of absence specialist said it does not. Reports she does not have a blank copy of the FMLA for my completion and thinks it will be ok for me to mark through the 07/20/2022 date and put 05/26/2022. States she will confirm at later time with leave of absence specialist if mark through allowed and if not she will notify me. No further issues/concerns.  Past Medical History:  Diagnosis Date   Hypertension    Irritable bowel syndrome (IBS)    OSA (obstructive sleep apnea)     Vitamin D deficiency    No Known Allergies  Current Outpatient Medications on File Prior to Visit  Medication Sig Dispense Refill   acetaminophen (TYLENOL) 500 MG tablet Take 2 tablets (1,000 mg total) by mouth every 8 (eight) hours as needed for up to 60 doses for mild pain or fever. 120 tablet 0   amLODipine (NORVASC) 5 MG tablet Take 1 tablet (5 mg total) by mouth daily. 90 tablet 0   clotrimazole-betamethasone (LOTRISONE) cream Apply to affected area 2 times daily as needed 45 g 0   docusate sodium (COLACE) 50 MG capsule Take 1 capsule (50 mg total) by mouth 2 (two) times daily. 60 capsule 1   fluconazole (DIFLUCAN) 150 MG tablet Take 1 tablet PO once. Repeat in 3 days if needed. 2 tablet 0   hydrochlorothiazide (HYDRODIURIL) 50 MG tablet Take 1 tablet (50 mg total) by mouth daily. 90 tablet 0   hydrOXYzine (VISTARIL) 25 MG capsule Take 1 capsule (25 mg total) by mouth every 8 (eight) hours as needed. 30 capsule 2   ibuprofen (ADVIL) 600 MG tablet Take 1 tablet (600 mg total) by mouth every 8 (eight) hours as needed for up to 60 doses for fever, headache, mild pain or moderate pain (Inflammation). Take 1 tablet 3 times daily as needed for inflammation of upper airways and/or pain. 60 tablet 0   polyethylene glycol powder (GLYCOLAX/MIRALAX) 17 GM/SCOOP powder Take 17 g by mouth daily. 3350 g 0   sertraline (ZOLOFT) 25 MG tablet Take 1 tablet (25 mg total) by mouth daily. 30 tablet 2   No current  facility-administered medications on file prior to visit.    Observations/Objective: Alert and oriented x 3. Not in acute distress. Physical examination not completed as this is a telemedicine visit.  Assessment and Plan: 1. Encounter for completion of form with patient 2. Major depressive disorder, remission status unspecified, unspecified whether recurrent 3. GAD (generalized anxiety disorder) - Family and Medical Leave Act form updated on today. Beginning date revised from 07/20/2022 to  05/26/2022. Patient states she thinks mark through of 07/20/2022 date will be ok and if not she will ask employer to fax me a new copy for completion. Discussed with patient FMLA forms are usually covered for 12 month increments. However, per patient request I did leave the end date of 07/22/2023 which would be closer to 14 months. Patient stated she will confirm with employer's leave of absence specialist to see if end date needs to be revised to August 2024. Patient is aware to follow-up with me as needed.  - Keep appointment scheduled 08/20/2022 with Behavioral Health.   Follow Up Instructions: Follow-up with primary provider as scheduled.   Patient was given clear instructions to go to Emergency Department or return to medical center if symptoms don't improve, worsen, or new problems develop.The patient verbalized understanding.  I discussed the assessment and treatment plan with the patient. The patient was provided an opportunity to ask questions and all were answered. The patient agreed with the plan and demonstrated an understanding of the instructions.   The patient was advised to call back or seek an in-person evaluation if the symptoms worsen or if the condition fails to improve as anticipated.   I provided 13 minutes total of non-face-to-face time during this encounter.   Camillia Herter, NP  [facility] [place], [place] 07/25/2022, 9:38 AM

## 2022-07-25 ENCOUNTER — Telehealth (INDEPENDENT_AMBULATORY_CARE_PROVIDER_SITE_OTHER): Payer: 59 | Admitting: Family

## 2022-07-25 DIAGNOSIS — F411 Generalized anxiety disorder: Secondary | ICD-10-CM | POA: Diagnosis not present

## 2022-07-25 DIAGNOSIS — Z0289 Encounter for other administrative examinations: Secondary | ICD-10-CM | POA: Diagnosis not present

## 2022-07-25 DIAGNOSIS — F329 Major depressive disorder, single episode, unspecified: Secondary | ICD-10-CM

## 2022-07-25 DIAGNOSIS — R69 Illness, unspecified: Secondary | ICD-10-CM | POA: Diagnosis not present

## 2022-07-25 NOTE — Progress Notes (Signed)
Pt needs update to forms to reflect 08/07 25 hours work schedule

## 2022-07-31 NOTE — Progress Notes (Signed)
Erroneous encounter-disregard

## 2022-08-04 ENCOUNTER — Encounter: Payer: 59 | Admitting: Family

## 2022-08-10 ENCOUNTER — Ambulatory Visit
Admission: RE | Admit: 2022-08-10 | Discharge: 2022-08-10 | Disposition: A | Discharge status: Home / Self Care | Payer: 59 | Source: Ambulatory Visit | Attending: Physician Assistant | Admitting: Physician Assistant

## 2022-08-10 VITALS — BP 127/82 | HR 74 | Temp 97.8°F | Resp 18

## 2022-08-10 DIAGNOSIS — Z3202 Encounter for pregnancy test, result negative: Secondary | ICD-10-CM

## 2022-08-10 DIAGNOSIS — R399 Unspecified symptoms and signs involving the genitourinary system: Secondary | ICD-10-CM | POA: Diagnosis not present

## 2022-08-10 LAB — POCT URINALYSIS DIP (MANUAL ENTRY)
Bilirubin, UA: NEGATIVE
Blood, UA: NEGATIVE
Glucose, UA: NEGATIVE mg/dL
Ketones, POC UA: NEGATIVE mg/dL
Leukocytes, UA: NEGATIVE
Nitrite, UA: NEGATIVE
Protein Ur, POC: NEGATIVE mg/dL
Spec Grav, UA: 1.02 (ref 1.010–1.025)
Urobilinogen, UA: 0.2 E.U./dL
pH, UA: 5.5 (ref 5.0–8.0)

## 2022-08-10 LAB — POCT URINE PREGNANCY: Preg Test, Ur: NEGATIVE

## 2022-08-10 NOTE — ED Triage Notes (Signed)
Pt c/o urinary incontinence and frequency that stated about 2-3 days ago.   Home interventions: none

## 2022-08-10 NOTE — ED Provider Notes (Signed)
[facility]    [name]: [id number] Arrival date & time: 08/10/22  1000      History   Chief Complaint Chief Complaint  Patient presents with   Urinary Retention    Entered by patient   Urinary Frequency    HPI Vanessa Burton is a 46 y.o. female.   Patient here today for evaluation of urinary incontinence and frequency that started 2 to 3 days ago.  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  She denies any abdominal pain, she has not had any nausea, vomiting.  She denies any fever.  She has not had any back pain.  She does request vaginal swab as well  The history is provided by the patient.  Urinary Frequency Pertinent negatives include no abdominal pain and no shortness of breath.    Past Medical History:  Diagnosis Date   Hypertension    Irritable bowel syndrome (IBS)    OSA (obstructive sleep apnea)    Vitamin D deficiency     Patient Active Problem List   Diagnosis Date Noted   GERD (gastroesophageal reflux disease) 06/29/2018   Bilateral leg edema 04/01/2018   Obesity 04/01/2018   Vitamin D deficiency 04/01/2018   Anxiety 03/30/2018   Depression 03/30/2018   Essential hypertension 03/30/2018   Irritable bowel syndrome with constipation 03/30/2018   OSA (obstructive sleep apnea) 03/30/2018   Lower urinary tract symptoms (LUTS) 01/08/2017   Acne 01/06/2013    Past Surgical History:  Procedure Laterality Date   TUBAL LIGATION      OB History   No obstetric history on file.      Home Medications    Prior to Admission medications   Medication Sig Start Date End Date Taking? Authorizing Provider  acetaminophen (TYLENOL) 500 MG tablet Take 2 tablets (1,000 mg total) by mouth every 8 (eight) hours as needed for up to 60 doses for mild pain or fever. 10/16/21   Theadora Rama Scales, PA-C  amLODipine (NORVASC) 5 MG tablet Take 1 tablet (5 mg total)  by mouth daily. 05/14/22 08/12/22  Rema Fendt, NP  clotrimazole-betamethasone (LOTRISONE) cream Apply to affected area 2 times daily as needed 06/13/21   Wieters, Hallie C, PA-C  docusate sodium (COLACE) 50 MG capsule Take 1 capsule (50 mg total) by mouth 2 (two) times daily. 04/21/22   Rema Fendt, NP  fluconazole (DIFLUCAN) 150 MG tablet Take 1 tablet PO once. Repeat in 3 days if needed. 07/08/22   Waldon Merl, PA-C  hydrochlorothiazide (HYDRODIURIL) 50 MG tablet Take 1 tablet (50 mg total) by mouth daily. 05/14/22 08/12/22  Rema Fendt, NP  hydrOXYzine (VISTARIL) 25 MG capsule Take 1 capsule (25 mg total) by mouth every 8 (eight) hours as needed. 05/14/22   Rema Fendt, NP  ibuprofen (ADVIL) 600 MG tablet Take 1 tablet (600 mg total) by mouth every 8 (eight) hours as needed for up to 60 doses for fever, headache, mild pain or moderate pain (Inflammation). Take 1 tablet 3 times daily as needed for inflammation of upper airways and/or pain. 10/16/21   Theadora Rama Scales, PA-C  polyethylene glycol powder (GLYCOLAX/MIRALAX) 17 GM/SCOOP powder Take 17 g by mouth daily. 03/20/21   Rema Fendt, NP  sertraline (ZOLOFT) 25 MG tablet Take 1 tablet (25 mg total) by mouth daily. 05/14/22   Rema Fendt, NP  Family History Family History  Problem Relation Age of Onset   Hypertension Mother    Heart attack Mother    Kidney disease Father    Diabetes Father     Social History Social History   Tobacco Use   Smoking status: Never    Passive exposure: Never   Smokeless tobacco: Never  Vaping Use   Vaping Use: Never used  Substance Use Topics   Alcohol use: Never   Drug use: Never     Allergies   Patient has no known allergies.   Review of Systems Review of Systems  Constitutional:  Negative for chills and fever.  Eyes:  Negative for discharge and redness.  Respiratory:  Negative for shortness of breath.   Gastrointestinal:  Negative for abdominal pain, nausea  and vomiting.  Genitourinary:  Positive for frequency. Negative for dysuria.  Musculoskeletal:  Negative for back pain.     Physical Exam Triage Vital Signs ED Triage Vitals  Enc Vitals Group     BP 08/10/22 1012 127/82     Pulse Rate 08/10/22 1012 74     Resp 08/10/22 1012 18     Temp 08/10/22 1012 97.8 F (36.6 C)     Temp Source 08/10/22 1012 Oral     SpO2 08/10/22 1012 97 %     Weight --      Height --      Head Circumference --      Peak Flow --      Pain Score 08/10/22 1009 8     Pain Loc --      Pain Edu? --      Excl. in GC? --    No data found.  Updated Vital Signs BP 127/82 (BP Location: Left Arm)   Pulse 74   Temp 97.8 F (36.6 C) (Oral)   Resp 18   LMP 07/27/2022 (Approximate)   SpO2 97%      Physical Exam Vitals and nursing note reviewed.  Constitutional:      General: She is not in acute distress.    Appearance: Normal appearance. She is not ill-appearing.  HENT:     Head: Normocephalic and atraumatic.  Eyes:     Conjunctiva/sclera: Conjunctivae normal.  Cardiovascular:     Rate and Rhythm: Normal rate.  Pulmonary:     Effort: Pulmonary effort is normal. No respiratory distress.  Neurological:     Mental Status: She is alert.  Psychiatric:        Mood and Affect: Mood normal.        Behavior: Behavior normal.        Thought Content: Thought content normal.      UC Treatments / Results  Labs (all labs ordered are listed, but only abnormal results are displayed) Labs Reviewed  URINE CULTURE  POCT URINALYSIS DIP (MANUAL ENTRY)  POCT URINE PREGNANCY  CERVICOVAGINAL ANCILLARY ONLY    EKG   Radiology No results found.  Procedures Procedures (including critical care time)  Medications Ordered in UC Medications - No data to display  Initial Impression / Assessment and Plan / UC Course  I have reviewed the triage vital signs and the nursing notes.  Pertinent labs & imaging results that were available during my care of the  patient were reviewed by me and considered in my medical decision making (see chart for details).    UA without concerning findings.  Urine pregnancy negative.  Will order urine culture to definitively rule out infection and vaginal  swab ordered as requested.  Will await results for further recommendations and  encouraged follow-up with any further concerns while awaiting results.  Final Clinical Impressions(s) / UC Diagnoses   Final diagnoses:  Lower urinary tract symptoms (LUTS)   Discharge Instructions   None    ED Prescriptions   None    PDMP not reviewed this encounter.   Francene Finders, PA-C 08/10/22 1132

## 2022-08-11 LAB — CERVICOVAGINAL ANCILLARY ONLY
Bacterial Vaginitis (gardnerella): NEGATIVE
Candida Glabrata: NEGATIVE
Candida Vaginitis: NEGATIVE
Chlamydia: NEGATIVE
Comment: NEGATIVE
Comment: NEGATIVE
Comment: NEGATIVE
Comment: NEGATIVE
Comment: NEGATIVE
Comment: NORMAL
Neisseria Gonorrhea: NEGATIVE
Trichomonas: NEGATIVE

## 2022-08-11 LAB — URINE CULTURE: Culture: NO GROWTH

## 2022-08-12 NOTE — Progress Notes (Signed)
Patient ID: Vanessa Burton, female    DOB: 12/30/1975  MRN: [id number]  CC: No chief complaint on file.   Subjective: Vanessa Burton is a 46 y.o. female who presents for  Her concerns today include:   HEALTH ASSESSMENT -states needs TB testing also for employer asap   Screenings due in care gaps  Patient Active Problem List   Diagnosis Date Noted   GERD (gastroesophageal reflux disease) 06/29/2018   Bilateral leg edema 04/01/2018   Obesity 04/01/2018   Vitamin D deficiency 04/01/2018   Anxiety 03/30/2018   Depression 03/30/2018   Essential hypertension 03/30/2018   Irritable bowel syndrome with constipation 03/30/2018   OSA (obstructive sleep apnea) 03/30/2018   Lower urinary tract symptoms (LUTS) 01/08/2017   Acne 01/06/2013     Current Outpatient Medications on File Prior to Visit  Medication Sig Dispense Refill   acetaminophen (TYLENOL) 500 MG tablet Take 2 tablets (1,000 mg total) by mouth every 8 (eight) hours as needed for up to 60 doses for mild pain or fever. 120 tablet 0   amLODipine (NORVASC) 5 MG tablet Take 1 tablet (5 mg total) by mouth daily. 90 tablet 0   clotrimazole-betamethasone (LOTRISONE) cream Apply to affected area 2 times daily as needed 45 g 0   docusate sodium (COLACE) 50 MG capsule Take 1 capsule (50 mg total) by mouth 2 (two) times daily. 60 capsule 1   fluconazole (DIFLUCAN) 150 MG tablet Take 1 tablet PO once. Repeat in 3 days if needed. 2 tablet 0   hydrochlorothiazide (HYDRODIURIL) 50 MG tablet Take 1 tablet (50 mg total) by mouth daily. 90 tablet 0   hydrOXYzine (VISTARIL) 25 MG capsule Take 1 capsule (25 mg total) by mouth every 8 (eight) hours as needed. 30 capsule 2   ibuprofen (ADVIL) 600 MG tablet Take 1 tablet (600 mg total) by mouth every 8 (eight) hours as needed for up to 60 doses for fever, headache, mild pain or moderate pain (Inflammation). Take 1 tablet 3 times daily as needed for inflammation of upper airways and/or  pain. 60 tablet 0   polyethylene glycol powder (GLYCOLAX/MIRALAX) 17 GM/SCOOP powder Take 17 g by mouth daily. 3350 g 0   sertraline (ZOLOFT) 25 MG tablet Take 1 tablet (25 mg total) by mouth daily. 30 tablet 2   No current facility-administered medications on file prior to visit.    No Known Allergies  Social History   Socioeconomic History   Marital status: Single    Spouse name: Not on file   Number of children: 2   Years of education: Not on file   Highest education level: Not on file  Occupational History   Occupation: school bus driver  Tobacco Use   Smoking status: Never    Passive exposure: Never   Smokeless tobacco: Never  Vaping Use   Vaping Use: Never used  Substance and Sexual Activity   Alcohol use: Never   Drug use: Never   Sexual activity: Not on file  Other Topics Concern   Not on file  Social History Narrative   Not on file   Social Determinants of Health   Financial Resource Strain: Not on file  Food Insecurity: Not on file  Transportation Needs: Not on file  Physical Activity: Not on file  Stress: Not on file  Social Connections: Not on file  Intimate Partner Violence: Not on file    Family History  Problem Relation Age of Onset   Hypertension Mother  Heart attack Mother    Kidney disease Father    Diabetes Father     Past Surgical History:  Procedure Laterality Date   TUBAL LIGATION      ROS: Review of Systems Negative except as stated above  PHYSICAL EXAM: LMP 07/27/2022 (Approximate)   Physical Exam  {female adult master:310786} {female adult master:310785}     Latest Ref Rng & Units 03/20/2022    2:06 PM 01/04/2021    2:52 PM 10/23/2020    8:53 AM  CMP  Glucose 70 - 99 mg/dL 92   94   BUN 6 - 24 mg/dL 10   11   Creatinine 0.01 - 1.00 mg/dL 7.49   4.49   Sodium 675 - 144 mmol/L 141   137   Potassium 3.5 - 5.2 mmol/L 3.8   3.9   Chloride 96 - 106 mmol/L 101   104   CO2 22 - 32 mmol/L   25   Calcium 8.7 - 10.2 mg/dL  91.6   9.2   Total Protein 6.0 - 8.5 g/dL 8.0  8.5    Total Bilirubin 0.0 - 1.2 mg/dL 0.5  0.6    Alkaline Phos 44 - 121 IU/L 80  92    AST 0 - 40 IU/L 17  21    ALT 0 - 32 IU/L  13     Lipid Panel     Component Value Date/Time   CHOL 185 01/04/2021 1452   TRIG 43 01/04/2021 1452   HDL 47 01/04/2021 1452   CHOLHDL 3.9 01/04/2021 1452   LDLCALC 130 (H) 01/04/2021 1452    CBC    Component Value Date/Time   WBC 4.8 03/20/2022 1406   WBC 4.9 10/23/2020 0853   RBC 4.23 03/20/2022 1406   RBC 3.97 10/23/2020 0853   HGB 12.4 03/20/2022 1406   HCT 38.4 03/20/2022 1406   PLT 317 03/20/2022 1406   MCV 91 03/20/2022 1406   MCH 29.3 03/20/2022 1406   MCH 29.2 10/23/2020 0853   MCHC 32.3 03/20/2022 1406   MCHC 31.4 10/23/2020 0853   RDW 13.9 03/20/2022 1406   LYMPHSABS 1.7 03/20/2022 1406   MONOABS 0.5 10/23/2020 0853   EOSABS 0.1 03/20/2022 1406   BASOSABS 0.0 03/20/2022 1406    ASSESSMENT AND PLAN:  There are no diagnoses linked to this encounter.   Patient was given the opportunity to ask questions.  Patient verbalized understanding of the plan and was able to repeat key elements of the plan. Patient was given clear instructions to go to Emergency Department or return to medical center if symptoms don't improve, worsen, or new problems develop.The patient verbalized understanding.   No orders of the defined types were placed in this encounter.    Requested Prescriptions    No prescriptions requested or ordered in this encounter    No follow-ups on file.  Rema Fendt, NP

## 2022-08-13 ENCOUNTER — Ambulatory Visit (INDEPENDENT_AMBULATORY_CARE_PROVIDER_SITE_OTHER): Payer: 59 | Admitting: Family

## 2022-08-13 ENCOUNTER — Telehealth: Payer: Self-pay | Admitting: Family

## 2022-08-13 ENCOUNTER — Encounter: Payer: Self-pay | Admitting: Family

## 2022-08-13 ENCOUNTER — Ambulatory Visit: Payer: 59

## 2022-08-13 VITALS — BP 118/73 | HR 72 | Temp 98.3°F | Resp 16 | Ht 65.98 in | Wt 202.0 lb

## 2022-08-13 DIAGNOSIS — Z1231 Encounter for screening mammogram for malignant neoplasm of breast: Secondary | ICD-10-CM | POA: Diagnosis not present

## 2022-08-13 DIAGNOSIS — Z130 Encounter for screening for diseases of the blood and blood-forming organs and certain disorders involving the immune mechanism: Secondary | ICD-10-CM

## 2022-08-13 DIAGNOSIS — F411 Generalized anxiety disorder: Secondary | ICD-10-CM

## 2022-08-13 DIAGNOSIS — Z13228 Encounter for screening for other metabolic disorders: Secondary | ICD-10-CM

## 2022-08-13 DIAGNOSIS — Z1329 Encounter for screening for other suspected endocrine disorder: Secondary | ICD-10-CM

## 2022-08-13 DIAGNOSIS — F329 Major depressive disorder, single episode, unspecified: Secondary | ICD-10-CM | POA: Diagnosis not present

## 2022-08-13 DIAGNOSIS — Z111 Encounter for screening for respiratory tuberculosis: Secondary | ICD-10-CM | POA: Diagnosis not present

## 2022-08-13 DIAGNOSIS — Z1211 Encounter for screening for malignant neoplasm of colon: Secondary | ICD-10-CM | POA: Diagnosis not present

## 2022-08-13 DIAGNOSIS — Z13220 Encounter for screening for lipoid disorders: Secondary | ICD-10-CM | POA: Diagnosis not present

## 2022-08-13 DIAGNOSIS — Z23 Encounter for immunization: Secondary | ICD-10-CM | POA: Diagnosis not present

## 2022-08-13 DIAGNOSIS — E559 Vitamin D deficiency, unspecified: Secondary | ICD-10-CM

## 2022-08-13 DIAGNOSIS — Z131 Encounter for screening for diabetes mellitus: Secondary | ICD-10-CM

## 2022-08-13 DIAGNOSIS — Z0000 Encounter for general adult medical examination without abnormal findings: Secondary | ICD-10-CM | POA: Diagnosis not present

## 2022-08-13 NOTE — Progress Notes (Signed)
.  Pt presents for annual physical exam w/o pap completed 09/19/2020 w/[facility] -wants to know if there is therapist she can see, current referral appt changed from 11/01-11/22 -pt looking to change employment, has health assessment form for [facility]  -wants to complete Cologuard vs Colonoscopy

## 2022-08-13 NOTE — Patient Instructions (Signed)
Preventive Care 40-46 Years Old, Female Preventive care refers to lifestyle choices and visits with your health care provider that can promote health and wellness. Preventive care visits are also called wellness exams. What can I expect for my preventive care visit? Counseling Your health care provider may ask you questions about your: Medical history, including: Past medical problems. Family medical history. Pregnancy history. Current health, including: Menstrual cycle. Method of birth control. Emotional well-being. Home life and relationship well-being. Sexual activity and sexual health. Lifestyle, including: Alcohol, nicotine or tobacco, and drug use. Access to firearms. Diet, exercise, and sleep habits. Work and work environment. Sunscreen use. Safety issues such as seatbelt and bike helmet use. Physical exam Your health care provider will check your: Height and weight. These may be used to calculate your BMI (body mass index). BMI is a measurement that tells if you are at a healthy weight. Waist circumference. This measures the distance around your waistline. This measurement also tells if you are at a healthy weight and may help predict your risk of certain diseases, such as type 2 diabetes and high blood pressure. Heart rate and blood pressure. Body temperature. Skin for abnormal spots. What immunizations do I need?  Vaccines are usually given at various ages, according to a schedule. Your health care provider will recommend vaccines for you based on your age, medical history, and lifestyle or other factors, such as travel or where you work. What tests do I need? Screening Your health care provider may recommend screening tests for certain conditions. This may include: Lipid and cholesterol levels. Diabetes screening. This is done by checking your blood sugar (glucose) after you have not eaten for a while (fasting). Pelvic exam and Pap test. Hepatitis B test. Hepatitis C  test. HIV (human immunodeficiency virus) test. STI (sexually transmitted infection) testing, if you are at risk. Lung cancer screening. Colorectal cancer screening. Mammogram. Talk with your health care provider about when you should start having regular mammograms. This may depend on whether you have a family history of breast cancer. BRCA-related cancer screening. This may be done if you have a family history of breast, ovarian, tubal, or peritoneal cancers. Bone density scan. This is done to screen for osteoporosis. Talk with your health care provider about your test results, treatment options, and if necessary, the need for more tests. Follow these instructions at home: Eating and drinking  Eat a diet that includes fresh fruits and vegetables, whole grains, lean protein, and low-fat dairy products. Take vitamin and mineral supplements as recommended by your health care provider. Do not drink alcohol if: Your health care provider tells you not to drink. You are pregnant, may be pregnant, or are planning to become pregnant. If you drink alcohol: Limit how much you have to 0-1 drink a day. Know how much alcohol is in your drink. In the [place] one drink equals one 12 oz bottle of beer (355 mL), one 5 oz glass of wine (148 mL), or one 1 oz glass of hard liquor (44 mL). Lifestyle Brush your teeth every morning and night with fluoride toothpaste. Floss one time each day. Exercise for at least 30 minutes 5 or more days each week. Do not use any products that contain nicotine or tobacco. These products include cigarettes, chewing tobacco, and vaping devices, such as e-cigarettes. If you need help quitting, ask your health care provider. Do not use drugs. If you are sexually active, practice safe sex. Use a condom or other form of protection to   prevent STIs. If you do not wish to become pregnant, use a form of birth control. If you plan to become pregnant, see your health care provider for a  prepregnancy visit. Take aspirin only as told by your health care provider. Make sure that you understand how much to take and what form to take. Work with your health care provider to find out whether it is safe and beneficial for you to take aspirin daily. Find healthy ways to manage stress, such as: Meditation, yoga, or listening to music. Journaling. Talking to a trusted person. Spending time with friends and family. Minimize exposure to UV radiation to reduce your risk of skin cancer. Safety Always wear your seat belt while driving or riding in a vehicle. Do not drive: If you have been drinking alcohol. Do not ride with someone who has been drinking. When you are tired or distracted. While texting. If you have been using any mind-altering substances or drugs. Wear a helmet and other protective equipment during sports activities. If you have firearms in your house, make sure you follow all gun safety procedures. Seek help if you have been physically or sexually abused. What's next? Visit your health care provider once a year for an annual wellness visit. Ask your health care provider how often you should have your eyes and teeth checked. Stay up to date on all vaccines. This information is not intended to replace advice given to you by your health care provider. Make sure you discuss any questions you have with your health care provider. Document Revised: 04/03/2021 Document Reviewed: 04/03/2021 Elsevier Patient Education  [place].

## 2022-08-14 LAB — CBC
Hematocrit: 39.3 % (ref 34.0–46.6)
Hemoglobin: 13.2 g/dL (ref 11.1–15.9)
MCH: 30.7 pg (ref 26.6–33.0)
MCHC: 33.6 g/dL (ref 31.5–35.7)
MCV: 91 fL (ref 79–97)
Platelets: 353 10*3/uL (ref 150–450)
RBC: 4.3 x10E6/uL (ref 3.77–5.28)
RDW: 13.2 % (ref 11.7–15.4)
WBC: 4.7 10*3/uL (ref 3.4–10.8)

## 2022-08-14 LAB — CMP14+EGFR
ALT: 12 IU/L (ref 0–32)
AST: 14 IU/L (ref 0–40)
Albumin/Globulin Ratio: 1.3 (ref 1.2–2.2)
Albumin: 4.5 g/dL (ref 3.9–4.9)
Alkaline Phosphatase: 84 IU/L (ref 44–121)
BUN/Creatinine Ratio: 16 (ref 9–23)
BUN: 14 mg/dL (ref 6–24)
Bilirubin Total: 0.4 mg/dL (ref 0.0–1.2)
CO2: 26 mmol/L (ref 20–29)
Calcium: 10 mg/dL (ref 8.7–10.2)
Chloride: 98 mmol/L (ref 96–106)
Creatinine, Ser: 0.88 mg/dL (ref 0.57–1.00)
Globulin, Total: 3.4 g/dL (ref 1.5–4.5)
Glucose: 87 mg/dL (ref 70–99)
Potassium: 4.1 mmol/L (ref 3.5–5.2)
Sodium: 140 mmol/L (ref 134–144)
Total Protein: 7.9 g/dL (ref 6.0–8.5)
eGFR: 82 mL/min/{1.73_m2} (ref >59)

## 2022-08-14 LAB — TSH: TSH: 0.967 u[IU]/mL (ref 0.450–4.500)

## 2022-08-14 LAB — HEMOGLOBIN A1C
Est. average glucose Bld gHb Est-mCnc: 120 mg/dL
Hgb A1c MFr Bld: 5.8 % — ABNORMAL HIGH (ref 4.8–5.6)

## 2022-08-14 LAB — VITAMIN D 25 HYDROXY (VIT D DEFICIENCY, FRACTURES): Vit D, 25-Hydroxy: 30 ng/mL (ref 30.0–100.0)

## 2022-08-14 LAB — LIPID PANEL
Chol/HDL Ratio: 2.9 ratio (ref 0.0–4.4)
Cholesterol, Total: 184 mg/dL (ref 100–199)
HDL: 64 mg/dL (ref >39)
LDL Chol Calc (NIH): 111 mg/dL — ABNORMAL HIGH (ref 0–99)
Triglycerides: 46 mg/dL (ref 0–149)
VLDL Cholesterol Cal: 9 mg/dL (ref 5–40)

## 2022-08-14 NOTE — Telephone Encounter (Signed)
LCSWA called patient today to introduce herself and to assess patients' mental health needs. Patient has would like to meet with therapist in regards to her anxiety and depression. I have her down for 10/31. But she would love to meet with someone more on going. Please let me know.

## 2022-08-15 LAB — TB SKIN TEST
Induration: 0 mm
TB Skin Test: NEGATIVE

## 2022-08-16 ENCOUNTER — Telehealth: Payer: Self-pay | Admitting: Family

## 2022-08-16 ENCOUNTER — Other Ambulatory Visit: Payer: Self-pay | Admitting: Family

## 2022-08-16 DIAGNOSIS — Z8719 Personal history of other diseases of the digestive system: Secondary | ICD-10-CM

## 2022-08-16 DIAGNOSIS — Z1211 Encounter for screening for malignant neoplasm of colon: Secondary | ICD-10-CM

## 2022-08-19 ENCOUNTER — Institutional Professional Consult (permissible substitution): Payer: 59 | Admitting: Licensed Clinical Social Worker

## 2022-08-20 ENCOUNTER — Ambulatory Visit (HOSPITAL_COMMUNITY): Payer: 59 | Admitting: Licensed Clinical Social Worker

## 2022-08-26 ENCOUNTER — Other Ambulatory Visit: Payer: Self-pay | Admitting: Family

## 2022-08-26 DIAGNOSIS — I10 Essential (primary) hypertension: Secondary | ICD-10-CM

## 2022-08-26 NOTE — Telephone Encounter (Signed)
Requested medications are due for refill today.  yes  Requested medications are on the active medications list.  yes  Last refill. 05/14/2022 #90 0 rf  Future visit scheduled.   no  Notes to clinic.  Rx written to expire 08/12/2022 - Rx is expired.    Requested Prescriptions  Pending Prescriptions Disp Refills   amLODipine (NORVASC) 5 MG tablet [Pharmacy Med Name: AMLODIPINE BESYLATE 5 MG TAB] 90 tablet 0    Sig: Take 1 tablet (5 mg total) by mouth daily.     Cardiovascular: Calcium Channel Blockers 2 Passed - 08/26/2022 12:05 PM      Passed - Last BP in normal range    BP Readings from Last 1 Encounters:  08/13/22 118/73         Passed - Last Heart Rate in normal range    Pulse Readings from Last 1 Encounters:  08/13/22 72         Passed - Valid encounter within last 6 months    Recent Outpatient Visits           1 week ago Annual physical exam   Primary Care at [facility], [place], NP   1 month ago Encounter for completion of form with patient   Primary Care at [facility], Amy J, NP   1 month ago Encounter for completion of form with patient   Primary Care at [facility], Amy J, NP   3 months ago Anxiety and depression   Primary Care at [facility], Amy J, NP   4 months ago Encounter for completion of form with patient   Primary Care at [facility], Flonnie Hailstone, NP

## 2022-09-01 ENCOUNTER — Telehealth: Payer: Self-pay | Admitting: Licensed Clinical Social Worker

## 2022-09-02 NOTE — Patient Outreach (Signed)
  Care Coordination   Initial Visit Note   09/02/2022 Name: Vanessa Burton MRN: [id number] DOB: May 17, 1976  Vanessa Burton is a 46 y.o. year old female who sees Rema Fendt, NP for primary care. I spoke with  Vanessa Burton by phone today.  What matters to the patients health and wellness today?  Care Coordination     Goals Addressed             This Visit's Progress    COMPLETED: LCSW Care Coordination Activities   On track    Care Coordination Interventions: Solution-Focused Strategies employed:  Active listening / Reflection utilized  Emotional Support Provided Verbalization of feelings encouraged  LCSW informed patient of care coordination services. Pt is interested and is agreeable to scheduling with assigned LCSW LCSW collaborated with Reece Levy. Chart routed to her for review and scheduling         SDOH assessments and interventions completed:  No     Care Coordination Interventions Activated:  Yes  Care Coordination Interventions:  Yes, provided   Follow up plan:  LCSW will follow up with patient    Encounter Outcome:  Pt. Scheduled   Jenel Lucks, MSW, LCSW [facility] Care Management [facility]  [facility] [email or web address] Phone [phone] 6:54 AM

## 2022-09-02 NOTE — Patient Instructions (Signed)
Visit Information  Thank you for taking time to visit with me today. Please don't hesitate to contact me if I can be of assistance to you.   Following are the goals we discussed today:   Goals Addressed             This Visit's Progress    COMPLETED: LCSW Care Coordination Activities   On track    Care Coordination Interventions: Solution-Focused Strategies employed:  Active listening / Reflection utilized  Emotional Support Provided Verbalization of feelings encouraged  LCSW informed patient of care coordination services. Pt is interested and is agreeable to scheduling with assigned LCSW LCSW collaborated with Reece Levy. Chart routed to her for review and scheduling         LCSW Reece Levy will follow up for scheduling  Please call the care guide team at [phone] if you need to cancel or reschedule your appointment.   If you are experiencing a Mental Health or Behavioral Health Crisis or need someone to talk to, please call the Suicide and Crisis Lifeline: 988 call 911   Patient verbalizes understanding of instructions and care plan provided today and agrees to view in MyChart. Active MyChart status and patient understanding of how to access instructions and care plan via MyChart confirmed with patient.     Vanessa Burton, MSW, LCSW [facility] Care Management [place]  [facility] [email or web address] Phone [phone] 6:55 AM

## 2022-09-04 ENCOUNTER — Telehealth: Payer: Self-pay | Admitting: *Deleted

## 2022-09-04 NOTE — Progress Notes (Signed)
  Care Coordination  Outreach Note  09/04/2022 Name: Vanessa Burton MRN: [id number] DOB: 1975/10/27   Care Coordination Outreach Attempts: An unsuccessful telephone outreach was attempted today to offer the patient information about available care coordination services as a benefit of their health plan.   Follow Up Plan:  Additional outreach attempts will be made to offer the patient care coordination information and services.   Encounter Outcome:  No Answer  Christie Nottingham  Care Coordination Care Guide  Direct Dial: [phone]

## 2022-09-10 ENCOUNTER — Ambulatory Visit (INDEPENDENT_AMBULATORY_CARE_PROVIDER_SITE_OTHER): Payer: 59 | Admitting: Licensed Clinical Social Worker

## 2022-09-10 DIAGNOSIS — R69 Illness, unspecified: Secondary | ICD-10-CM | POA: Diagnosis not present

## 2022-09-10 DIAGNOSIS — F411 Generalized anxiety disorder: Secondary | ICD-10-CM | POA: Diagnosis not present

## 2022-09-10 DIAGNOSIS — F331 Major depressive disorder, recurrent, moderate: Secondary | ICD-10-CM

## 2022-09-10 NOTE — Progress Notes (Signed)
Virtual Visit via Audio Note  I connected with Vanessa Burton on 09/10/22 at  9:00 AM EST by an audio enabled telemedicine application and verified that I am speaking with the correct person using two identifiers.  Location: Patient: home Provider: remote office [place], [place])   I discussed the limitations of evaluation and management by telemedicine and the availability of in person appointments. The patient expressed understanding and agreed to proceed.   I discussed the assessment and treatment plan with the patient. The patient was provided an opportunity to ask questions and all were answered. The patient agreed with the plan and demonstrated an understanding of the instructions.   The patient was advised to call back or seek an in-person evaluation if the symptoms worsen or if the condition fails to improve as anticipated.  I provided 20 minutes of non-face-to-face time during this encounter.   Tarri Guilfoil R Azha Constantin, LCSW   THERAPIST PROGRESS NOTE  Session Time: 9-920a  Participation Level: Active  Behavioral Response: NAAlertAngry and Irritable  Type of Therapy: Individual Therapy  Treatment Goals addressed: FMLA paperwork request  ProgressTowards Goals: Not Progressing  Interventions: Solution Focused  Summary: Vanessa Burton is a 46 y.o. female who presents with continuing symptoms related to depression/anxiety diagnosis. Pt states that she is continuing to experience stress and anxiety about returning to work duties after [employer] hour restrictions. Pt reports that after she was given a promotion she feels her workplace is not reducing the responsibilities that are part of her job description, and this is triggering stress.  Pt had lots of questions about FMLA paperwork, and requested that clinician fill out paperwork and give her time off. Encouraged pt to get PCP to fill out paperwork, since they were the ones that initially took pt out of work.  Pt  states that PCP office stated that now the therapist needs to fill out the paperwork since she is under care of therapist.  Discussed the need for data to support the need for taking pt out of work, and how this clinician has only seen pt one time for a CCA and no return visits so far. Informed pt of ROI process and HIPAA compliant communication due to sensitivity of BH information--pt kept making statements "why are you making such a big deal about this".  Pt kept stating: "the paperwork is so easy, the PCP just filled it out and signed it, I don't know why you are making such a big deal about it".  Clinician tried to make pt understand that it wasn't the clinician's decision to take pt out of work, so that is why there is hesitation about filling out the [facility] paperwork.  Pt continued to get more irate as clinician explained the process and offered to complete disability assessments to determine eligibility, and suggested IOP groups to support pt and help gain coping skills for managing symptoms. Pt refused to participate, feeling that it was unnecessary.  As pt started to get more and more irate, LCSW clinician informed pt that she felt uncomfortable completing any paperwork due to current insufficient data and limited time that clinician has been working with pt.  Pt got angry and called clinician unprofessional and racist--then hung up phone during session.  Suicidal/Homicidal: No  Therapist Response: Clinician gave pt options for assessment/tx--pt very insistent for clinician to take her out of work for leave and to complete FMLA paperwork. Due to insufficient data to support writing pt out of work for requested time, LCSW clinician  refused to complete paperwork.   Plan: Return again PRN  Diagnosis:  Encounter Diagnoses  Name Primary?   MDD (major depressive disorder), recurrent episode, moderate (HCC) Yes   Generalized anxiety disorder    Collaboration of Care: Other n/a at time of  session  Patient/Guardian was advised Release of Information must be obtained prior to any record release in order to collaborate their care with an outside provider. Patient/Guardian was advised if they have not already done so to contact the registration department to sign all necessary forms in order for [place] to release information regarding their care.   Consent: Patient/Guardian gives verbal consent for treatment and assignment of benefits for services provided during this visit. Patient/Guardian expressed understanding and agreed to proceed.   Ernest Haber Jerriann Schrom, LCSW 09/10/2022

## 2022-09-10 NOTE — Progress Notes (Signed)
  Care Coordination   Note   09/10/2022 Name: Vanessa Burton MRN: [id number] DOB: 1975/11/12  [name] is a 46 y.o. year old female who sees Rema Fendt, NP for primary care. I reached out to [employer] by phone today to offer care coordination services.  [name] was given information about Care Coordination services today including:   The Care Coordination services include support from the care team which includes your Nurse Coordinator, Clinical Social Worker, or Pharmacist.  The Care Coordination team is here to help remove barriers to the health concerns and goals most important to you. Care Coordination services are voluntary, and the patient may decline or stop services at any time by request to their care team member.   Care Coordination Consent Status: Patient agreed to services and verbal consent obtained.   Follow up plan:  Telephone appointment with care coordination team member scheduled for:  09/15/22  Encounter Outcome:  Pt. Scheduled  [facility] Coordination Care Guide  Direct Dial: [phone]

## 2022-09-14 ENCOUNTER — Other Ambulatory Visit: Payer: Self-pay | Admitting: Family

## 2022-09-14 DIAGNOSIS — I10 Essential (primary) hypertension: Secondary | ICD-10-CM

## 2022-09-15 ENCOUNTER — Other Ambulatory Visit: Payer: Self-pay | Admitting: Family

## 2022-09-15 ENCOUNTER — Telehealth: Payer: 59 | Admitting: Family

## 2022-09-15 ENCOUNTER — Ambulatory Visit: Payer: Self-pay | Admitting: *Deleted

## 2022-09-15 DIAGNOSIS — B3731 Acute candidiasis of vulva and vagina: Secondary | ICD-10-CM

## 2022-09-15 DIAGNOSIS — I10 Essential (primary) hypertension: Secondary | ICD-10-CM

## 2022-09-15 MED ORDER — HYDROCHLOROTHIAZIDE 50 MG PO TABS: 50.0000 mg | ORAL_TABLET | Freq: Every day | ORAL | 2 refills | Status: DC

## 2022-09-15 MED ORDER — FLUCONAZOLE 150 MG PO TABS: 150.0000 mg | ORAL_TABLET | ORAL | 0 refills | Status: DC | PRN

## 2022-09-15 MED ORDER — MICONAZOLE NITRATE 2 % VA CREA: 1.0000 | TOPICAL_CREAM | Freq: Every day | VAGINAL | 0 refills | Status: DC

## 2022-09-15 MED ORDER — CLOTRIMAZOLE-BETAMETHASONE 1-0.05 % EX CREA: 1.0000 | TOPICAL_CREAM | Freq: Every day | CUTANEOUS | 0 refills | Status: DC

## 2022-09-15 NOTE — Progress Notes (Signed)
Hello, I have sent in your cream you requested. Do not apply inside vaginally. I hope you feel better.   Jannifer Rodney, FNP

## 2022-09-15 NOTE — Telephone Encounter (Signed)
Order complete. 

## 2022-09-15 NOTE — Progress Notes (Signed)

## 2022-09-15 NOTE — Patient Outreach (Signed)
Care Coordination   Initial Visit Note   09/15/2022 Name: Vanessa Burton MRN: [id number] DOB: 1976/08/29  Vanessa Burton is a 46 y.o. year old female who sees Rema Fendt, NP for primary care. I spoke with  Vanessa Burton by phone today.  What matters to the patients health and wellness today?  Mental health wellness.    Goals Addressed             This Visit's Progress    Reduce symptoms of depression, anxiety and improve overall health and mental health       Care Coordination Interventions: CSW spoke with pt who reports she is wanting to get established with a new mental health therapist. Pt acknowledges her depression and anxiety are "worse recently". She denies SI/HI. Pt was started on RX in September and states she is not sure she is better and in some ways feels worse. Pt reports hopelessness and loneliness. Her work environment (works from home) leaves her feeling lonely and drained; dealing with customer complaints/issues all day.  Pt shares trouble with sleep, easily irritated and lack of motivation, apathy. CSW encouraged pt to seek other job opportunities within her current place of employment as well as possible other employers CSW encouraged pt to seek ways to connect, socialize and meet new people and engage to reduce her isolation/loneliness   Motivational Interviewing employed Depression screen reviewed  PHQ2/ PHQ9 completed- pt scoring about the same as previous screening; moderately high :     09/15/2022    3:16 PM 08/13/2022   10:08 AM 05/14/2022    8:55 AM 04/21/2022   10:37 AM 04/10/2022    9:27 AM  Depression screen PHQ 2/9  Decreased Interest 3 3 3 3 3   Down, Depressed, Hopeless 3 3 3 3 3   PHQ - 2 Score 6 6 6 6 6   Altered sleeping 3 3 3 3 3   Tired, decreased energy 3 3 3 3 3   Change in appetite 2 3 3 3 3   Feeling bad or failure about yourself  3 3 3 3 3   Trouble concentrating 3 3 3 3 3   Moving slowly or fidgety/restless 0  1 2 2 1   Suicidal thoughts 1 0 1 1 0  PHQ-9 Score 21 22 24 24 22   Difficult doing work/chores Extremely dIfficult Extremely dIfficult Extremely dIfficult Extremely dIfficult Extremely dIfficult    Solution-Focused Strategies employed:  Active listening / Reflection utilized  Emotional Support Provided           SDOH assessments and interventions completed:  Yes  SDOH Interventions Today    Flowsheet Row Most Recent Value  SDOH Interventions   Food Insecurity Interventions Intervention Not Indicated  Alcohol Usage Interventions Intervention Not Indicated (Score <7)        Care Coordination Interventions:  Yes, provided   Follow up plan: Referral made to Care Guide for food pantry resources Follow up call scheduled for 09/22/22    Encounter Outcome:  Pt. Visit Completed      09/15/2022    3:16 PM 08/13/2022   10:08 AM 05/14/2022    8:55 AM 04/21/2022   10:37 AM 04/10/2022    9:27 AM  Depression screen PHQ 2/9  Decreased Interest 3 3 3 3 3   Down, Depressed, Hopeless 3 3 3 3 3   PHQ - 2 Score 6 6 6 6 6   Altered sleeping 3 3 3 3 3   Tired, decreased energy 3 3 3 3 3   Change in  appetite 2 3 3 3 3   Feeling bad or failure about yourself  3 3 3 3 3   Trouble concentrating 3 3 3 3 3   Moving slowly or fidgety/restless 0 1 2 2 1   Suicidal thoughts 1 0 1 1 0  PHQ-9 Score 21 22 24 24 22   Difficult doing work/chores Extremely dIfficult Extremely dIfficult Extremely dIfficult Extremely dIfficult Extremely dIfficult

## 2022-09-15 NOTE — Patient Instructions (Signed)
Visit Information  Thank you for taking time to visit with me today. Please don't hesitate to contact me if I can be of assistance to you.   Following are the goals we discussed today:   Goals Addressed             This Visit's Progress    Reduce symptoms of depression, anxiety and improve overall health and mental health       Care Coordination Interventions: CSW spoke with pt who reports she is wanting to get established with a new mental health therapist. Pt acknowledges her depression and anxiety are "worse recently". She denies SI/HI. Pt was started on RX in September and states she is not sure she is better and in some ways feels worse. Pt reports hopelessness and loneliness. Her work environment (works from home) leaves her feeling lonely and drained; dealing with customer complaints/issues all day.  Pt shares trouble with sleep, easily irritated and lack of motivation, apathy. CSW encouraged pt to seek other job opportunities within her current place of employment as well as possible other employers CSW encouraged pt to seek ways to connect, socialize and meet new people and engage to reduce her isolation/loneliness   Motivational Interviewing employed Depression screen reviewed  PHQ2/ PHQ9 completed- pt scoring about the same as previous screening; moderately high :     09/15/2022    3:16 PM 08/13/2022   10:08 AM 05/14/2022    8:55 AM 04/21/2022   10:37 AM 04/10/2022    9:27 AM  Depression screen PHQ 2/9  Decreased Interest 3 3 3 3 3   Down, Depressed, Hopeless 3 3 3 3 3   PHQ - 2 Score 6 6 6 6 6   Altered sleeping 3 3 3 3 3   Tired, decreased energy 3 3 3 3 3   Change in appetite 2 3 3 3 3   Feeling bad or failure about yourself  3 3 3 3 3   Trouble concentrating 3 3 3 3 3   Moving slowly or fidgety/restless 0 1 2 2 1   Suicidal thoughts 1 0 1 1 0  PHQ-9 Score 21 22 24 24 22   Difficult doing work/chores Extremely dIfficult Extremely dIfficult Extremely dIfficult Extremely  dIfficult Extremely dIfficult    Solution-Focused Strategies employed:  Active listening / Reflection utilized  Emotional Support Provided           Our next appointment is by telephone on 09/22/22   Please call the care guide team at [phone] if you need to cancel or reschedule your appointment.   If you are experiencing a Mental Health or Behavioral Health Crisis or need someone to talk to, please call the Suicide and Crisis Lifeline: 988 call the National Suicide Prevention Lifeline: 314 261 6287 or TTY: 434 177 9396 TTY (707)808-1631) to talk to a trained counselor go to [facility] [address] Susan Moore [phone])   Patient verbalizes understanding of instructions and care plan provided today and agrees to view in MyChart. Active MyChart status and patient understanding of how to access instructions and care plan via MyChart confirmed with patient.     Telephone follow up appointment with care management team member scheduled for:09/22/22  10-26-1986 MSW, LCSW Licensed Clinical Social Worker      [phone]

## 2022-09-22 ENCOUNTER — Encounter: Payer: Self-pay | Admitting: *Deleted

## 2022-09-22 ENCOUNTER — Telehealth: Payer: Self-pay | Admitting: *Deleted

## 2022-09-22 NOTE — Patient Outreach (Signed)
  Care Coordination   09/22/2022 Name: Vanessa Burton MRN: [id number] DOB: [birth year]   Care Coordination Outreach Attempts:  An unsuccessful telephone outreach was attempted today to offer the patient information about available care coordination services as a benefit of their health plan.   Follow Up Plan:  Additional outreach attempts will be made to offer the patient care coordination information and services.   Encounter Outcome:  No Answer   Care Coordination Interventions:  No, not indicated    Reece Levy MSW, LCSW Licensed Clinical Social Worker      [phone]

## 2022-09-24 ENCOUNTER — Telehealth: Payer: Self-pay | Admitting: *Deleted

## 2022-09-24 NOTE — Progress Notes (Signed)
  Care Coordination Note  09/24/2022 Name: Vanessa Burton MRN: [id number] DOB: 04/02/1976  Vanessa Burton is a 46 y.o. year old female who is a primary care patient of Rema Fendt, NP and is actively engaged with the care management team. I reached out to [facility] by phone today to assist with re-scheduling a follow up visit with the Licensed Clinical Social Worker  Follow up plan: Unsuccessful telephone outreach attempt made. A HIPAA compliant phone message was left for the patient providing contact information and requesting a return call.    [facility]  Care Coordination Care Guide  Direct Dial: [phone]

## 2022-10-01 NOTE — Progress Notes (Signed)
  Care Coordination Note  10/01/2022 Name: Vanessa Burton MRN: [id number] DOB: 1976/01/27  [name] is a 46 y.o. year old female who is a primary care patient of Rema Fendt, NP and is actively engaged with the care management team. I reached out to [facility] by phone today to assist with re-scheduling a follow up visit with the Licensed Clinical Social Worker  Follow up plan: Unsuccessful telephone outreach attempt made. A HIPAA compliant phone message was left for the patient providing contact information and requesting a return call.   [name]  Care Coordination Care Guide  Direct Dial: [phone]

## 2022-10-16 NOTE — Progress Notes (Signed)
  Care Coordination Note  10/16/2022 Name: Vanessa Burton MRN: [id number] DOB: 1976/08/09  Vanessa Burton is a 46 y.o. year old female who is a primary care patient of Rema Fendt, NP and is actively engaged with the care management team. I reached out to [facility] by phone today to assist with scheduling a follow up visit with the Licensed Clinical Social Worker  Follow up plan: Unsuccessful telephone outreach attempt made. A HIPAA compliant phone message was left for the patient providing contact information and requesting a return call.  We have been unable to make contact with the patient for follow up. The care management team is available to follow up with the patient after provider conversation with the patient regarding recommendation for care management engagement and subsequent re-referral to the care management team.   [facility] Coordination Care Guide  Direct Dial: [phone]

## 2022-10-23 ENCOUNTER — Encounter: Payer: Self-pay | Admitting: Family

## 2022-10-23 ENCOUNTER — Telehealth: Payer: Self-pay | Admitting: *Deleted

## 2022-10-23 NOTE — Progress Notes (Signed)
  Care Coordination Note  10/23/2022 Name: Vanessa Burton MRN: [id number] DOB: 08-26-1976  [name] is a 47 y.o. year old female who is a primary care patient of Camillia Herter, NP and is actively engaged with the care management team. I reached out to [facility] by phone today to assist with re-scheduling a follow up visit with the Licensed Clinical Social Worker  Follow up plan: Telephone appointment with care management team member scheduled for:10/30/22 [place]: [phone]

## 2022-10-24 DIAGNOSIS — G4733 Obstructive sleep apnea (adult) (pediatric): Secondary | ICD-10-CM | POA: Diagnosis not present

## 2022-10-24 DIAGNOSIS — I10 Essential (primary) hypertension: Secondary | ICD-10-CM | POA: Diagnosis not present

## 2022-10-24 NOTE — Telephone Encounter (Unsigned)
Copied from [place] [phone]. Topic: General - Other >> Oct 23, 2022 12:48 PM Cyndi Bender wrote: Reason for CRM: Pt requests that either Amy Minette Brine or [place] call her back at [phone]

## 2022-10-27 NOTE — Telephone Encounter (Signed)
Pt scheduled w/Amy on 10/28/22

## 2022-10-27 NOTE — Progress Notes (Signed)
Virtual Visit via Telephone Note  I connected with Vanessa Burton, on 10/28/2022 at 3:13 PM by telephone and verified that I am speaking with the correct person using two identifiers.  Consent: I discussed the limitations, risks, security and privacy concerns of performing an evaluation and management service by telephone and the availability of in person appointments. I also discussed with the patient that there may be a patient responsible charge related to this service. The patient expressed understanding and agreed to proceed.   Location of Patient: Home  Location of Provider: [facility]   Persons participating in Telemedicine visit: [facility] Ricky Stabs, NP Margorie John, CMA   History of Present Illness: Vanessa Burton is a 47 y.o. female who presents for paperwork.   Patient reports she has been out of work on leave of absence since 09/13/2022. She is an employee at [employer]. Her job title is client escalations support specialist. She is planning to return to work on 11/03/2022. Her goal was to return to work with accommodations in a new job title/role of frontline calls. Reports she spoke with her supervisor who informed her that she would need to apply for the new title/role and that no accommodations could be made for her in her present title/role. Therefore, she plans to return to work on 11/03/2022 full time and requests to have the 2 flare-ups monthly to continue as on previous FMLA. States she needs FMLA faxed today because it is overdue. I discussed with patient I will ask the CMA to fax today.   Reports she had an unpleasant patient experience with her therapist and did not officially see the psychiatrist. States when she asked therapist to complete her FMLA paperwork it seemed they were uncomfortable completing and they directed her back to primary care for completion. She is ready for a new referral to Psychiatry.     She is due for her colonoscopy.   She requests referral back to sleep medicine for a new evaluation. States she has been using the same sleep machine since 2018/2019.  Past Medical History:  Diagnosis Date   Hypertension    Irritable bowel syndrome (IBS)    OSA (obstructive sleep apnea)    Vitamin D deficiency    No Known Allergies  Current Outpatient Medications on File Prior to Visit  Medication Sig Dispense Refill   acetaminophen (TYLENOL) 500 MG tablet Take 2 tablets (1,000 mg total) by mouth every 8 (eight) hours as needed for up to 60 doses for mild pain or fever. 120 tablet 0   amLODipine (NORVASC) 5 MG tablet TAKE 1 TABLET (5 MG TOTAL) BY MOUTH DAILY. 90 tablet 0   clotrimazole-betamethasone (LOTRISONE) cream Apply 1 Application topically daily. 30 g 0   docusate sodium (COLACE) 50 MG capsule Take 1 capsule (50 mg total) by mouth 2 (two) times daily. 60 capsule 1   fluconazole (DIFLUCAN) 150 MG tablet Take 1 tablet (150 mg total) by mouth every three (3) days as needed. 3 tablet 0   hydrochlorothiazide (HYDRODIURIL) 50 MG tablet Take 1 tablet (50 mg total) by mouth daily. 30 tablet 2   hydrOXYzine (VISTARIL) 25 MG capsule Take 1 capsule (25 mg total) by mouth every 8 (eight) hours as needed. 30 capsule 2   ibuprofen (ADVIL) 600 MG tablet Take 1 tablet (600 mg total) by mouth every 8 (eight) hours as needed for up to 60 doses for fever, headache, mild pain or moderate pain (Inflammation).  Take 1 tablet 3 times daily as needed for inflammation of upper airways and/or pain. 60 tablet 0   miconazole (MONISTAT 7) 2 % vaginal cream Place 1 Applicatorful vaginally at bedtime. 45 g 0   sertraline (ZOLOFT) 25 MG tablet Take 1 tablet (25 mg total) by mouth daily. 30 tablet 2   No current facility-administered medications on file prior to visit.    Observations/Objective: Alert and oriented x 3. Not in acute distress. Physical examination not completed as this is a telemedicine  visit.  Assessment and Plan: 1. Encounter for completion of form with patient 2. Anxiety and depression - Certificate of Health Care Provider for Teammate's Serious Health Condition completed today in office.  - Referral to Psychiatry for further evaluation/management.  - Ambulatory referral to Psychiatry  3. Colon cancer screening 4. History of colon polyps - Referral to Gastroenterology for further evaluation/management.  - Ambulatory referral to Gastroenterology  5. OSA (obstructive sleep apnea) - Sleep study for further evaluation.  - PSG Sleep Study; Future   Follow Up Instructions: Follow-up with primary provider as scheduled. Referral to Psychiatry, Gastroenterology, and sleep study.    Patient was given clear instructions to go to Emergency Department or return to medical center if symptoms don't improve, worsen, or new problems develop.The patient verbalized understanding.  I discussed the assessment and treatment plan with the patient. The patient was provided an opportunity to ask questions and all were answered. The patient agreed with the plan and demonstrated an understanding of the instructions.   The patient was advised to call back or seek an in-person evaluation if the symptoms worsen or if the condition fails to improve as anticipated.    I provided 20 minutes total of non-face-to-face time during this encounter.   Camillia Herter, NP  [facility] [place], [place] 10/28/2022, 3:13 PM

## 2022-10-28 ENCOUNTER — Encounter: Payer: Self-pay | Admitting: Family

## 2022-10-28 ENCOUNTER — Ambulatory Visit (INDEPENDENT_AMBULATORY_CARE_PROVIDER_SITE_OTHER): Payer: Commercial Managed Care - HMO | Admitting: Family

## 2022-10-28 DIAGNOSIS — F32A Depression, unspecified: Secondary | ICD-10-CM | POA: Diagnosis not present

## 2022-10-28 DIAGNOSIS — G4733 Obstructive sleep apnea (adult) (pediatric): Secondary | ICD-10-CM

## 2022-10-28 DIAGNOSIS — Z0289 Encounter for other administrative examinations: Secondary | ICD-10-CM

## 2022-10-28 DIAGNOSIS — Z86010 Personal history of colonic polyps: Secondary | ICD-10-CM | POA: Diagnosis not present

## 2022-10-28 DIAGNOSIS — Z1211 Encounter for screening for malignant neoplasm of colon: Secondary | ICD-10-CM

## 2022-10-28 DIAGNOSIS — R69 Illness, unspecified: Secondary | ICD-10-CM | POA: Diagnosis not present

## 2022-10-28 DIAGNOSIS — F419 Anxiety disorder, unspecified: Secondary | ICD-10-CM

## 2022-10-30 ENCOUNTER — Telehealth: Payer: Self-pay | Admitting: Family

## 2022-10-30 ENCOUNTER — Ambulatory Visit: Payer: Self-pay | Admitting: *Deleted

## 2022-10-30 NOTE — Telephone Encounter (Deleted)
Callback

## 2022-10-30 NOTE — Patient Instructions (Signed)
Visit Information  Thank you for taking time to visit with me today. Please don't hesitate to contact me if I can be of assistance to you.   Following are the goals we discussed today:   Goals Addressed             This Visit's Progress    Reduce symptoms of depression, anxiety and improve overall health and mental health       Care Coordination Interventions: CSW spoke with pt who states "I survived the holidays". Pt is planning to return to work next week as her FMLA ends.  Pt continues to report depression, isolation, anxiety-including social anxiety and is  wanting to get established with a new mental health therapist. She denies SI/HI. Pt was started on RX in September. Pt reports hopelessness and loneliness. Her work environment (works from home) leaves her feeling lonely and drained; dealing with customer complaints/issues all day.  Pt shares trouble with sleep, easily irritated and lack of motivation, apathy. CSW previously encouraged pt to seek other job opportunities within her current place of employment as well as possible other employers- she continues to consider options and seek job opportunities with and outside of current employer.  CSW  previously encouraged pt to seek ways to connect, socialize and meet new people and engage to reduce her isolation/loneliness- states she hasnot done of this due to depression and social anxiety   Motivational Interviewing employed Depression screen reviewed  PHQ2/ PHQ9 completed- pt scoring about the same as previous screening; moderately high :  Solution-Focused Strategies employed:  Active listening / Reflection utilized  Emotional Support Provided Pt agreeable to referral being sent to [facility] for counseling appointment Encouraged pt to call [employer] [address] 211 (resources) and 988 for mental health support if needed      10/30/2022   10:14 AM 09/15/2022    3:16 PM 08/13/2022   10:08 AM 05/14/2022    8:55 AM  04/21/2022   10:37 AM  Depression screen PHQ 2/9  Decreased Interest 3 3 3 3 3   Down, Depressed, Hopeless 3 3 3 3 3   PHQ - 2 Score 6 6 6 6 6   Altered sleeping 3 3 3 3 3   Tired, decreased energy 3 3 3 3 3   Change in appetite 2 2 3 3 3   Feeling bad or failure about yourself  3 3 3 3 3   Trouble concentrating 3 3 3 3 3   Moving slowly or fidgety/restless 3 0 1 2 2   Suicidal thoughts 0 1 0 1 1  PHQ-9 Score 23 21 22 24 24   Difficult doing work/chores Extremely dIfficult Extremely dIfficult Extremely dIfficult Extremely dIfficult Extremely dIfficult           Our next appointment is by telephone on 11/10/22 at 10am  Please call the care guide team at [phone] if you need to cancel or reschedule your appointment.   If you are experiencing a Mental Health or [place] or need someone to talk to, please call the Suicide and Crisis Lifeline: 988 call the [place] National Suicide Prevention Lifeline: 804-061-5208 or TTY: 248-587-0960 TTY 249-777-9062) to talk to a trained counselor call 911   Patient verbalizes understanding of instructions and care plan provided today and agrees to view in [place]. Active MyChart status and patient understanding of how to access instructions and care plan via MyChart confirmed with patient.     Telephone follow up appointment with care management team member scheduled for: 11/10/22  Eduard Clos MSW, LCSW Licensed Clinical Social Worker    [phone]

## 2022-10-30 NOTE — Patient Outreach (Signed)
Care Coordination   Follow Up Visit Note   10/30/2022 Name: Vanessa Burton MRN: [id number] DOB: 05-29-1976  Vanessa Burton is a 47 y.o. year old female who sees Camillia Herter, NP for primary care. I spoke with  Vanessa Burton by phone today.  What matters to the patients health and wellness today?  Mental health wellness.    Goals Addressed             This Visit's Progress    Reduce symptoms of depression, anxiety and improve overall health and mental health       Care Coordination Interventions: CSW spoke with pt who states "I survived the holidays". Pt is planning to return to work next week as her FMLA ends.  Pt continues to report depression, isolation, anxiety-including social anxiety and is  wanting to get established with a new mental health therapist. She denies SI/HI. Pt was started on RX in September. Pt reports hopelessness and loneliness. Her work environment (works from home) leaves her feeling lonely and drained; dealing with customer complaints/issues all day.  Pt shares trouble with sleep, easily irritated and lack of motivation, apathy. CSW previously encouraged pt to seek other job opportunities within her current place of employment as well as possible other employers- she continues to consider options and seek job opportunities with and outside of current employer.  CSW  previously encouraged pt to seek ways to connect, socialize and meet new people and engage to reduce her isolation/loneliness- states she hasnot done of this due to depression and social anxiety   Motivational Interviewing employed Depression screen reviewed  PHQ2/ PHQ9 completed- pt scoring about the same as previous screening; moderately high :  Solution-Focused Strategies employed:  Active listening / Reflection utilized  Emotional Support Provided Pt agreeable to referral being sent to [facility] for counseling appointment Encouraged pt to  call [employer] [address] (resources) and 988 for mental health support if needed      10/30/2022   10:14 AM 09/15/2022    3:16 PM 08/13/2022   10:08 AM 05/14/2022    8:55 AM 04/21/2022   10:37 AM  Depression screen PHQ 2/9  Decreased Interest 3 3 3 3 3   Down, Depressed, Hopeless 3 3 3 3 3   PHQ - 2 Score 6 6 6 6 6   Altered sleeping 3 3 3 3 3   Tired, decreased energy 3 3 3 3 3   Change in appetite 2 2 3 3 3   Feeling bad or failure about yourself  3 3 3 3 3   Trouble concentrating 3 3 3 3 3   Moving slowly or fidgety/restless 3 0 1 2 2   Suicidal thoughts 0 1 0 1 1  PHQ-9 Score 23 21 22 24 24   Difficult doing work/chores Extremely dIfficult Extremely dIfficult Extremely dIfficult Extremely dIfficult Extremely dIfficult           SDOH assessments and interventions completed:  Yes  SDOH Interventions Today    Flowsheet Row Most Recent Value  SDOH Interventions   Food Insecurity Interventions Intervention Not Indicated  Housing Interventions Intervention Not Indicated  Utilities Interventions Intervention Not Indicated  Depression Interventions/Treatment  Medication, Counseling        Care Coordination Interventions:  Yes, provided   Follow up plan: Referral made to [facility] Follow up call scheduled for 11/10/22    Encounter Outcome:  Pt. Visit Completed   depression      10/30/2022   10:14 AM 09/15/2022  3:16 PM 08/13/2022   10:08 AM 05/14/2022    8:55 AM 04/21/2022   10:37 AM  Depression screen PHQ 2/9  Decreased Interest 3 3 3 3 3   Down, Depressed, Hopeless 3 3 3 3 3   PHQ - 2 Score 6 6 6 6 6   Altered sleeping 3 3 3 3 3   Tired, decreased energy 3 3 3 3 3   Change in appetite 2 2 3 3 3   Feeling bad or failure about yourself  3 3 3 3 3   Trouble concentrating 3 3 3 3 3   Moving slowly or fidgety/restless 3 0 1 2 2   Suicidal thoughts 0 1 0 1 1  PHQ-9 Score 23 21 22 24 24   Difficult doing work/chores Extremely dIfficult Extremely dIfficult Extremely  dIfficult Extremely dIfficult Extremely dIfficult

## 2022-10-30 NOTE — Telephone Encounter (Addendum)
Patient is calling to see if her FMLA paperwork was faxed to her job. Per patient the paperwork needed to be faxed back to her job by 10/22/2022. Patient is wanting a call  back or message placed in her mychart to let her know the paper work was faxed to her job.   Please advise.

## 2022-10-30 NOTE — Telephone Encounter (Signed)
Pt has been contacted.

## 2022-10-31 NOTE — Telephone Encounter (Signed)
Pt called back today saying she contacted her employer and they told her they do not have all the paperwork that they need to return her to work/  CB#  [phone]

## 2022-11-03 NOTE — Telephone Encounter (Signed)
Spoke to pt to advise that provider is out of office till Wednesday and the return to work certificate will be completed at that time, pt states that #11 on FMLA should have been checked "yes" and not "no" , advised pt that when she is mentally/physically able to handle her care, property or finances, then she is not considered incapacitated.  Pt states that on the RTW certification needs to indicate restrictions as- Intermittent 2 days off per month starting 09/13/22-09/13/23.

## 2022-11-05 ENCOUNTER — Encounter: Payer: No Typology Code available for payment source | Admitting: Family

## 2022-11-05 ENCOUNTER — Telehealth: Payer: Self-pay | Admitting: Family

## 2022-11-05 NOTE — Progress Notes (Signed)
Erroneous encounter-disregard

## 2022-11-10 ENCOUNTER — Ambulatory Visit: Payer: Self-pay | Admitting: *Deleted

## 2022-11-10 ENCOUNTER — Encounter: Payer: Self-pay | Admitting: Family

## 2022-11-10 NOTE — Telephone Encounter (Unsigned)
Copied from [place] [id number]. Topic: Appointment Scheduling - Scheduling Inquiry for Clinic >> Nov 10, 2022 11:07 AM Erskine Squibb wrote: Reason for CRM: I received a call from [facility] with [employer] who spoke with the patient and the patient stated she needs her FMLA paperwork filled out to be updated to clear her to return to work by this Wed 1/24. She is currently scheduled for the next soonest appointment which is next Monday the 29t and on the wait list. . Please assist patient further

## 2022-11-10 NOTE — Patient Outreach (Signed)
  Care Coordination   Follow Up Visit Note   11/10/2022 Name: Vanessa Burton MRN: [id number] DOB: 03/03/1976  Vanessa Burton is a 47 y.o. year old female who sees Camillia Herter, NP for primary care. I spoke with  Vanessa Burton by phone today.  What matters to the patients health and wellness today?  Trying to get clearance to return to work from [employer] and connect with Company secretary.     Goals Addressed             This Visit's Progress    Reduce symptoms of depression, anxiety and improve overall health and mental health       Care Coordination Interventions: CSW spoke with pt today- indicating things are "about the same". She previously had expressed "surviving the holidays".  Pt plans to see PCP today to assist with paperwork to clear her for return to work from her FMLA.  Pt with hisory of depression, isolation, anxiety-including social anxiety and wanting to get established with a new mental health therapist. She denies SI/HI. Pt was started on RX in September. Pt reports hopelessness and loneliness. Her work environment (works from home) leaves her feeling lonely and drained; dealing with customer complaints/issues all day.  Pt shares trouble with sleep, easily irritated and lack of motivation, apathy. CSW previously encouraged pt to seek other job opportunities within her current place of employment as well as possible other employers- she continues to consider options and seek job opportunities with and outside of current employer.  CSW  previously encouraged pt to seek ways to connect, socialize and meet new people and engage to reduce her isolation/loneliness- states she hasnot done of this due to depression and social anxiety   Pt previously [facility] to [facility]- pt has not connected with them- CSW called [facility] who had left message for pt- CSW called pt back to provide their # to her for scheduling Motivational Interviewing employed Depression screen  reviewed  PHQ2/ PHQ9 completed- pt scoring about the same as previous screening; moderately high :  Solution-Focused Strategies employed:  Active listening / Reflection utilized  Emotional Support Provided Encouraged pt to call [employer] [address] (resources) and 988 for mental health support if needed      10/30/2022   10:14 AM 09/15/2022    3:16 PM 08/13/2022   10:08 AM 05/14/2022    8:55 AM 04/21/2022   10:37 AM  Depression screen PHQ 2/9  Decreased Interest 3 3 3 3 3   Down, Depressed, Hopeless 3 3 3 3 3   PHQ - 2 Score 6 6 6 6 6   Altered sleeping 3 3 3 3 3   Tired, decreased energy 3 3 3 3 3   Change in appetite 2 2 3 3 3   Feeling bad or failure about yourself  3 3 3 3 3   Trouble concentrating 3 3 3 3 3   Moving slowly or fidgety/restless 3 0 1 2 2   Suicidal thoughts 0 1 0 1 1  PHQ-9 Score 23 21 22 24 24   Difficult doing work/chores Extremely dIfficult Extremely dIfficult Extremely dIfficult Extremely dIfficult Extremely dIfficult           SDOH assessments and interventions completed:  Yes     Care Coordination Interventions:  Yes, provided   Follow up plan: Follow up call scheduled for 11/18/22    Encounter Outcome:  Pt. Visit Completed

## 2022-11-10 NOTE — Patient Instructions (Signed)
Visit Information  Thank you for taking time to visit with me today. Please don't hesitate to contact me if I can be of assistance to you.   Following are the goals we discussed today:   Goals Addressed             This Visit's Progress    Reduce symptoms of depression, anxiety and improve overall health and mental health       Care Coordination Interventions: CSW spoke with pt today- indicating things are "about the same". She previously had expressed "surviving the holidays".  Pt plans to see PCP today to assist with paperwork to clear her for return to work from her FMLA.  Pt with hisory of depression, isolation, anxiety-including social anxiety and wanting to get established with a new mental health therapist. She denies SI/HI. Pt was started on RX in September. Pt reports hopelessness and loneliness. Her work environment (works from home) leaves her feeling lonely and drained; dealing with customer complaints/issues all day.  Pt shares trouble with sleep, easily irritated and lack of motivation, apathy. CSW previously encouraged pt to seek other job opportunities within her current place of employment as well as possible other employers- she continues to consider options and seek job opportunities with and outside of current employer.  CSW  previously encouraged pt to seek ways to connect, socialize and meet new people and engage to reduce her isolation/loneliness- states she hasnot done of this due to depression and social anxiety   Pt previously referred to [facility]- pt has not connected with them- CSW called [facility] who had left message for pt- CSW called pt back to provide their # to her for scheduling Motivational Interviewing employed Depression screen reviewed  PHQ2/ PHQ9 completed- pt scoring about the same as previous screening; moderately high :  Solution-Focused Strategies employed:  Active listening / Reflection utilized  Emotional Support Provided Encouraged pt to call [employer] [address]  [address] (resources) and 988 for mental health support if needed      10/30/2022   10:14 AM 09/15/2022    3:16 PM 08/13/2022   10:08 AM 05/14/2022    8:55 AM 04/21/2022   10:37 AM  Depression screen PHQ 2/9  Decreased Interest 3 3 3 3 3   Down, Depressed, Hopeless 3 3 3 3 3   PHQ - 2 Score 6 6 6 6 6   Altered sleeping 3 3 3 3 3   Tired, decreased energy 3 3 3 3 3   Change in appetite 2 2 3 3 3   Feeling bad or failure about yourself  3 3 3 3 3   Trouble concentrating 3 3 3 3 3   Moving slowly or fidgety/restless 3 0 1 2 2   Suicidal thoughts 0 1 0 1 1  PHQ-9 Score 23 21 22 24 24   Difficult doing work/chores Extremely dIfficult Extremely dIfficult Extremely dIfficult Extremely dIfficult Extremely dIfficult           Our next appointment is by telephone on 11/18/22   Please call the care guide team at [phone] if you need to cancel or reschedule your appointment.   If you are experiencing a Mental Health or [place] or need someone to talk to, please call the Suicide and Crisis Lifeline: 988 call the [place] National Suicide Prevention Lifeline: 567 259 7582 or TTY: 770-294-9245 TTY (609) 536-8362) to talk to a trained counselor call 911   Patient verbalizes understanding of instructions and care plan provided today and agrees to view in [place]. Active MyChart status and patient  understanding of how to access instructions and care plan via MyChart confirmed with patient.     Telephone follow up appointment with care management team member scheduled for:  11/18/22

## 2022-11-12 NOTE — Progress Notes (Unsigned)
Patient ID: Vanessa Burton, female    DOB: Oct 14, 1976  MRN: [id number]  CC: FMLA Paperwork   Subjective: Vanessa Burton is a 47 y.o. female who presents for FMLA paperwork.   Her concerns today include:  10/28/2022 per note: History of Present Illness: Vanessa Burton is a 47 y.o. female who presents for paperwork.    Patient reports she has been out of work on leave of absence since 09/13/2022. She is an employee at [employer]. Her job title is client escalations support specialist. She is planning to return to work on 11/03/2022. Her goal was to return to work with accommodations in a new job title/role of frontline calls. Reports she spoke with her supervisor who informed her that she would need to apply for the new title/role and that no accommodations could be made for her in her present title/role. Therefore, she plans to return to work on 11/03/2022 full time and requests to have the 2 flare-ups monthly to continue as on previous FMLA. States she needs FMLA faxed today because it is overdue. I discussed with patient I will ask the CMA to fax today.    Reports she had an unpleasant patient experience with her therapist and did not officially see the psychiatrist. States when she asked therapist to complete her FMLA paperwork it seemed they were uncomfortable completing and they directed her back to primary care for completion. She is ready for a new referral to Psychiatry.    She is due for her colonoscopy.    She requests referral back to sleep medicine for a new evaluation. States she has been using the same sleep machine since 2018/2019.  Assessment and Plan: 1. Encounter for completion of form with patient 2. Anxiety and depression - Certificate of Health Care Provider for Teammate's Serious Health Condition completed today in office.  - Referral to Psychiatry for further evaluation/management.  - Ambulatory referral to Psychiatry   3. Colon cancer screening 4. History  of colon polyps - Referral to Gastroenterology for further evaluation/management.  - Ambulatory referral to Gastroenterology   5. OSA (obstructive sleep apnea) - Sleep study for further evaluation.  - PSG Sleep Study; Future   11/03/2022 per CMA note: Spoke to pt to advise that provider is out of office till Wednesday and the return to work certificate will be completed at that time, pt states that #11 on FMLA should have been checked "yes" and not "no" , advised pt that when she is mentally/physically able to handle her care, property or finances, then she is not considered incapacitated.  Pt states that on the RTW certification needs to indicate restrictions as- Intermittent 2 days off per month starting 09/13/22-09/13/23.    Patient Active Problem List   Diagnosis Date Noted   GERD (gastroesophageal reflux disease) 06/29/2018   Bilateral leg edema 04/01/2018   Obesity 04/01/2018   Vitamin D deficiency 04/01/2018   Anxiety 03/30/2018   Depression 03/30/2018   Hypertensive disorder 03/30/2018   Irritable bowel syndrome 03/30/2018   OSA (obstructive sleep apnea) 03/30/2018   Lower urinary tract symptoms (LUTS) 01/08/2017   Acne 01/06/2013     Current Outpatient Medications on File Prior to Visit  Medication Sig Dispense Refill   acetaminophen (TYLENOL) 500 MG tablet Take 2 tablets (1,000 mg total) by mouth every 8 (eight) hours as needed for up to 60 doses for mild pain or fever. 120 tablet 0   amLODipine (NORVASC) 5 MG tablet TAKE 1 TABLET (5 MG TOTAL)  BY MOUTH DAILY. 90 tablet 0   clotrimazole-betamethasone (LOTRISONE) cream Apply 1 Application topically daily. 30 g 0   docusate sodium (COLACE) 50 MG capsule Take 1 capsule (50 mg total) by mouth 2 (two) times daily. 60 capsule 1   fluconazole (DIFLUCAN) 150 MG tablet Take 1 tablet (150 mg total) by mouth every three (3) days as needed. 3 tablet 0   hydrochlorothiazide (HYDRODIURIL) 50 MG tablet Take 1 tablet (50 mg total) by mouth  daily. 30 tablet 2   hydrOXYzine (VISTARIL) 25 MG capsule Take 1 capsule (25 mg total) by mouth every 8 (eight) hours as needed. 30 capsule 2   ibuprofen (ADVIL) 600 MG tablet Take 1 tablet (600 mg total) by mouth every 8 (eight) hours as needed for up to 60 doses for fever, headache, mild pain or moderate pain (Inflammation). Take 1 tablet 3 times daily as needed for inflammation of upper airways and/or pain. 60 tablet 0   miconazole (MONISTAT 7) 2 % vaginal cream Place 1 Applicatorful vaginally at bedtime. 45 g 0   sertraline (ZOLOFT) 25 MG tablet Take 1 tablet (25 mg total) by mouth daily. 30 tablet 2   No current facility-administered medications on file prior to visit.    No Known Allergies  Social History   Socioeconomic History   Marital status: Single    Spouse name: Not on file   Number of children: 2   Years of education: Not on file   Highest education level: Not on file  Occupational History   Occupation: school bus driver  Tobacco Use   Smoking status: Never    Passive exposure: Never   Smokeless tobacco: Never  Vaping Use   Vaping Use: Never used  Substance and Sexual Activity   Alcohol use: Never   Drug use: Never   Sexual activity: Not on file  Other Topics Concern   Not on file  Social History Narrative   Not on file   Social Determinants of Health   Financial Resource Strain: Not on file  Food Insecurity: No Food Insecurity (10/30/2022)   Hunger Vital Sign    Worried About Running Out of Food in the Last Year: Never true    Ran Out of Food in the Last Year: Never true  Transportation Needs: Unknown (09/15/2022)   PRAPARE - Administrator, Civil Service (Medical): No    Lack of Transportation (Non-Medical): Not on file  Physical Activity: Not on file  Stress: Not on file  Social Connections: Not on file  Intimate Partner Violence: Not At Risk (09/15/2022)   Humiliation, Afraid, Rape, and Kick questionnaire    Fear of Current or  Ex-Partner: No    Emotionally Abused: No    Physically Abused: No    Sexually Abused: No    Family History  Problem Relation Age of Onset   Hypertension Mother    Heart attack Mother    Kidney disease Father    Diabetes Father     Past Surgical History:  Procedure Laterality Date   TUBAL LIGATION      ROS: Review of Systems Negative except as stated above  PHYSICAL EXAM: There were no vitals taken for this visit.  Physical Exam  {female adult master:310786} {female adult master:310785}     Latest Ref Rng & Units 08/13/2022   11:54 AM 03/20/2022    2:06 PM 01/04/2021    2:52 PM  CMP  Glucose 70 - 99 mg/dL 87  92  BUN 6 - 24 mg/dL 14  10    Creatinine 1.61 - 1.00 mg/dL 0.96  0.45    Sodium 409 - 144 mmol/L 140  141    Potassium 3.5 - 5.2 mmol/L 4.1  3.8    Chloride 96 - 106 mmol/L 98  101    CO2 20 - 29 mmol/L 26     Calcium 8.7 - 10.2 mg/dL 81.1  91.4    Total Protein 6.0 - 8.5 g/dL 7.9  8.0  8.5   Total Bilirubin 0.0 - 1.2 mg/dL 0.4  0.5  0.6   Alkaline Phos 44 - 121 IU/L 84  80  92   AST 0 - 40 IU/L 14  17  21    ALT 0 - 32 IU/L 12   13    Lipid Panel     Component Value Date/Time   CHOL 184 08/13/2022 1154   TRIG 46 08/13/2022 1154   HDL 64 08/13/2022 1154   CHOLHDL 2.9 08/13/2022 1154   LDLCALC 111 (H) 08/13/2022 1154    CBC    Component Value Date/Time   WBC 4.7 08/13/2022 1154   WBC 4.9 10/23/2020 0853   RBC 4.30 08/13/2022 1154   RBC 3.97 10/23/2020 0853   HGB 13.2 08/13/2022 1154   HCT 39.3 08/13/2022 1154   PLT 353 08/13/2022 1154   MCV 91 08/13/2022 1154   MCH 30.7 08/13/2022 1154   MCH 29.2 10/23/2020 0853   MCHC 33.6 08/13/2022 1154   MCHC 31.4 10/23/2020 0853   RDW 13.2 08/13/2022 1154   LYMPHSABS 1.7 03/20/2022 1406   MONOABS 0.5 10/23/2020 0853   EOSABS 0.1 03/20/2022 1406   BASOSABS 0.0 03/20/2022 1406    ASSESSMENT AND PLAN:  There are no diagnoses linked to this encounter.   Patient was given the opportunity to ask  questions.  Patient verbalized understanding of the plan and was able to repeat key elements of the plan. Patient was given clear instructions to go to Emergency Department or return to medical center if symptoms don't improve, worsen, or new problems develop.The patient verbalized understanding.   No orders of the defined types were placed in this encounter.    Requested Prescriptions    No prescriptions requested or ordered in this encounter    No follow-ups on file.  Rema Fendt, NP

## 2022-11-13 ENCOUNTER — Encounter: Payer: No Typology Code available for payment source | Admitting: Family

## 2022-11-13 DIAGNOSIS — Z0289 Encounter for other administrative examinations: Secondary | ICD-10-CM

## 2022-11-13 DIAGNOSIS — F419 Anxiety disorder, unspecified: Secondary | ICD-10-CM

## 2022-11-13 NOTE — Telephone Encounter (Signed)
Copied from [place] [phone]. Topic: General - Other >> Nov 13, 2022  2:52 PM Ludger Nutting wrote: Patient is requesting a callback from [facility] to discuss FMLA paperwork. Please follow up with patient.

## 2022-11-14 ENCOUNTER — Telehealth: Payer: Self-pay | Admitting: Family

## 2022-11-14 NOTE — Telephone Encounter (Signed)
Pt is returning Jada's call. Pt is requesting a call back from the office manager regarding her [place]- [phone]

## 2022-11-14 NOTE — Telephone Encounter (Signed)
Noted  

## 2022-11-14 NOTE — Telephone Encounter (Signed)
Called patient to inform her about the dates on the fmla paperwork that has been filed, also to inform her that no further corrections will be made without being seen in office. Patient did not answer, LVM to return call at earliest convenience

## 2022-11-17 ENCOUNTER — Ambulatory Visit: Payer: No Typology Code available for payment source | Admitting: Family

## 2022-11-18 ENCOUNTER — Encounter: Payer: Self-pay | Admitting: *Deleted

## 2022-11-18 ENCOUNTER — Telehealth: Payer: Self-pay | Admitting: *Deleted

## 2022-11-18 NOTE — Patient Outreach (Signed)
  Care Coordination   11/18/2022 Name: Vanessa Burton MRN: [id number] DOB: 08-29-1976   Care Coordination Outreach Attempts:  An unsuccessful telephone outreach was attempted today to offer the patient information about available care coordination services as a benefit of their health plan.   Follow Up Plan:  Additional outreach attempts will be made to offer the patient care coordination information and services.   Encounter Outcome:  Pt. Visit Completed   Care Coordination Interventions:  No, not indicated    Eduard Clos, MSW, [place] Worker [employer] [phone]

## 2022-11-20 ENCOUNTER — Other Ambulatory Visit: Payer: Self-pay | Admitting: Family

## 2022-11-20 ENCOUNTER — Telehealth: Payer: Self-pay | Admitting: Family

## 2022-11-20 DIAGNOSIS — I10 Essential (primary) hypertension: Secondary | ICD-10-CM

## 2022-11-20 NOTE — Telephone Encounter (Signed)
Medication Refill - Medication: amLODipine (NORVASC) 5 MG tablet   Has the patient contacted their pharmacy? Yes.    (Agent: If yes, when and what did the pharmacy advise?) Contact provider   Preferred Pharmacy (with phone number or street name): [id number] - [place], [place]   Has the patient been seen for an appointment in the last year OR does the patient have an upcoming appointment? Yes.    Agent: Please be advised that RX refills may take up to 3 business days. We ask that you follow-up with your pharmacy.

## 2022-11-20 NOTE — Telephone Encounter (Signed)
Att to call pt no ans lvm

## 2022-11-20 NOTE — Telephone Encounter (Signed)
Copied from [place] [phone]. Topic: General - Other >> Nov 20, 2022  3:50 PM Santiya F wrote: Reason for CRM: Patient called in requesting a follow up on her FMLA paperwork. Patient says employer is requesting more information. Patient would like a call back today 11/20/2022 or tomorrow 11/21/2022.

## 2022-11-21 MED ORDER — AMLODIPINE BESYLATE 5 MG PO TABS: 5.0000 mg | ORAL_TABLET | Freq: Every day | ORAL | 0 refills | Status: DC

## 2022-11-21 NOTE — Telephone Encounter (Signed)
Requested Prescriptions  Pending Prescriptions Disp Refills   amLODipine (NORVASC) 5 MG tablet 90 tablet 0    Sig: Take 1 tablet (5 mg total) by mouth daily.     Cardiovascular: Calcium Channel Blockers 2 Passed - 11/20/2022  4:08 PM      Passed - Last BP in normal range    BP Readings from Last 1 Encounters:  08/13/22 118/73         Passed - Last Heart Rate in normal range    Pulse Readings from Last 1 Encounters:  08/13/22 72         Passed - Valid encounter within last 6 months    Recent Outpatient Visits           3 weeks ago Encounter for completion of form with patient   [facility] at [facility], [place], NP   3 months ago Annual physical exam   [place] [facility] at [facility], [place], NP   3 months ago Encounter for completion of form with patient   [facility] at [facility], [place], NP   4 months ago Encounter for completion of form with patient   [facility] at [facility], Amy J, NP   6 months ago Anxiety and depression   [facility] at [facility], Flonnie Hailstone, NP

## 2022-12-04 ENCOUNTER — Telehealth: Payer: Self-pay

## 2022-12-04 NOTE — Telephone Encounter (Signed)
10/28/22 appt completed for forms and FMLA paperwork submitted to [employer], 10/31/22 pt sent Mychart msg stating that #11 on the documents needed to be completed about "incapacitation" On 11/03/22 advised pt that she was not considered incapacitated by the provider that it was checked "no", so the form was not updated. On 11/05/22, provider signed the return to work for 11/03/22 and was uploaded to pt Mychart to show rtn to work was 11/03/22.  On 11/10/22 message sent requesting that return to work be updated to 11/12/22 and appt was scheduled for pt that she did not show to. Jada A, (patient access rep) att to contact pt on 11/14/22 to advise that an appt will be needed for any updates. I was out of office and when returned spoke to pt on 11/20/22 which she advised to call Elizebeth Brooking at [phone] to verify what was needed to complete form, spoke to [place] which stated that #11 regarding incapacitation means that the employee is unable to perform job duties due to illness, also asked if employee could have been at work advised Ailene Ravel that according to the forms I faxed pt was to return 11/03/22. #11 on form was updated and faxed on 11/27/22.  Speaking with the pt today she became very irate and asking me does it make sense that your job would let you come back to work when forms are not completed correctly, advised pt that we did not know that her employer kept her from returning to work due to #11 not being filled out on the forms, typically most employers give their employees extensions on paperwork if corrections are needed after they return back to work. Pt yelled and said "that's your employer not mine "call Ailene Ravel again and ask her can I come back to work w/incomplete forms". Adv pt that I will contact Ailene Ravel to confirm if she was not able to return to work due to the discrepancy of the forms. Call placed to Ailene Ravel out of office till 12/15/22 questioned pt on what the date should be of her return  to work, and she states 12/08/22. Mychart message sent to pt advising that visit would has been needed and has been scheduled due to provider needs to update progress note with new return to work date.

## 2022-12-04 NOTE — Telephone Encounter (Signed)
Pt is calling back in upset regarding her FMLA paper work. Per pt she is being told that the paper work that it being submitted is being submitted wrong. Pt states that the paper work has been submitted wrong three times.  The leave dates are off, and the end date is showing 11/03/2022 and it is not showing the dates of the entire leave. Per pt she has not returned back to work yet and is wanting to know why her end date is showing 11/03/2022.  Please call pt back

## 2022-12-04 NOTE — Telephone Encounter (Signed)
10/28/22 appt completed for forms and FMLA paperwork submitted to [employer], 10/31/22 pt sent Mychart msg stating that #11 on the documents needed to be completed about "incapacitation" On 11/03/22 advised pt that she was not considered incapacitated by the provider that it was checked "no", so the form was not updated. On 11/05/22, provider signed the return to work for 11/03/22 and was uploaded to pt Mychart to show rtn to work was 11/03/22.  On 11/10/22 message sent requesting that return to work be updated to 11/12/22 and appt was scheduled for pt that she did not show to. Jada A, (patient access rep) att to contact pt on 11/14/22 to advise that an appt will be needed for any updates. I was out of office and when returned spoke to pt on 11/20/22 which she advised to call Elizebeth Brooking at [phone] to verify what was needed to complete form, spoke to [place] which stated that #11 regarding incapacitation means that the employee is unable to perform job duties due to illness, also asked if employee could have been at work advised Ailene Ravel that according to the forms I faxed pt was to return 11/03/22. #11 on form was updated and faxed on 11/27/22.  Speaking with the pt today she became very irate and asking me does it make sense that your job would let you come back to work when forms are not completed correctly, advised pt that we did not know that her employer kept her from returning to work due to #11 not being filled out on the forms, typically most employers give their employees extensions on paperwork if corrections are needed after they return back to work. Pt yelled and said that's your employer not mine "call Ailene Ravel again and ask her can I come back to work w/incomplete forms. Adv pt that I will contact Ailene Ravel to confirm if she was not able to return to work due to the discrepancy of the forms. Call placed to Ailene Ravel out of office till 12/15/22 questioned pt on what the date should be of her return to  work, and she states 12/08/22. Mychart message sent to pt advising that visit would has been needed and has been scheduled due to provider needs to update progress note with new return to work date.

## 2022-12-05 ENCOUNTER — Telehealth: Payer: Self-pay

## 2022-12-05 ENCOUNTER — Encounter: Payer: No Typology Code available for payment source | Admitting: Family

## 2022-12-05 NOTE — Progress Notes (Signed)
I called patient at 10:04 am and 10:15 am and left a voicemail with each call for patient to return call for appointment. Also, Elmon Else, CMA called patient at 10:34 am and left a voicemail for patient to return call for appointment. We were unsuccessful on all attempts in connecting with patient.

## 2022-12-05 NOTE — Telephone Encounter (Signed)
Att to contact pt about video visit scheduled for today due to provider has attempted contact 2x being unsuccessful, no ans

## 2022-12-12 ENCOUNTER — Telehealth: Payer: Self-pay | Admitting: *Deleted

## 2022-12-12 ENCOUNTER — Other Ambulatory Visit: Payer: Self-pay | Admitting: Family

## 2022-12-12 DIAGNOSIS — I10 Essential (primary) hypertension: Secondary | ICD-10-CM

## 2022-12-12 NOTE — Progress Notes (Unsigned)
  Care Coordination Note  12/12/2022 Name: Vanessa Burton MRN: [id number] DOB: 07/21/1976  Vanessa Burton is a 47 y.o. year old female who is a primary care patient of Camillia Herter, NP and is actively engaged with the care management team. I reached out to [facility] by phone today to assist with re-scheduling a follow up visit with the Licensed Clinical Social Worker  Follow up plan: Unsuccessful telephone outreach attempt made. A HIPAA compliant phone message was left for the patient providing contact information and requesting a return call.   [place]  Direct Dial: [phone]

## 2022-12-12 NOTE — Telephone Encounter (Signed)
Change of pharmacy Requested Prescriptions  Pending Prescriptions Disp Refills   amLODipine (NORVASC) 5 MG tablet [Pharmacy Med Name: AMLODIPINE BESYLATE 5 MG TAB] 60 tablet 0    Sig: TAKE 1 TABLET (5 MG TOTAL) BY MOUTH DAILY.     Cardiovascular: Calcium Channel Blockers 2 Passed - 12/12/2022 12:53 AM      Passed - Last BP in normal range    BP Readings from Last 1 Encounters:  08/13/22 118/73         Passed - Last Heart Rate in normal range    Pulse Readings from Last 1 Encounters:  08/13/22 72         Passed - Valid encounter within last 6 months    Recent Outpatient Visits           1 month ago Encounter for completion of form with patient   [facility] at [facility], [place], NP   4 months ago Annual physical exam   [place] [facility] at [facility], [place], NP   4 months ago Encounter for completion of form with patient   [facility] at [facility], [place], NP   4 months ago Encounter for completion of form with patient   [facility] Primary Care at [facility], Amy J, NP   7 months ago Anxiety and depression   [facility] at [facility], Flonnie Hailstone, NP

## 2022-12-16 NOTE — Progress Notes (Signed)
  Care Coordination Note  12/16/2022 Name: Vanessa Burton MRN: [id number] DOB: 06-Nov-1975  Vanessa Burton is a 47 y.o. year old female who is a primary care patient of Camillia Herter, NP and is actively engaged with the care management team. I reached out to [facility] by phone today to assist with re-scheduling a follow up visit with the Licensed Clinical Social Worker  Follow up plan: Unsuccessful telephone outreach attempt made. A HIPAA compliant phone message was left for the patient providing contact information and requesting a return call. We have been unable to make contact with the patient for follow up.  [place]  Direct Dial: [phone]

## 2023-01-22 ENCOUNTER — Telehealth: Payer: Self-pay | Admitting: Urgent Care

## 2023-01-22 DIAGNOSIS — N760 Acute vaginitis: Secondary | ICD-10-CM

## 2023-01-22 MED ORDER — FLUCONAZOLE 150 MG PO TABS: ORAL_TABLET | ORAL | 0 refills | Status: DC

## 2023-01-22 MED ORDER — CLOTRIMAZOLE 2 % VA CREA: 1.0000 | TOPICAL_CREAM | Freq: Every day | VAGINAL | 0 refills | Status: AC

## 2023-01-22 NOTE — Progress Notes (Signed)
Duplicate encounter

## 2023-01-22 NOTE — Progress Notes (Signed)
E-Visit for Vaginal Symptoms  We are sorry that you are not feeling well. Here is how we plan to help! Based on what you shared with me it looks like you: May have a yeast vaginosis  Vaginosis is an inflammation of the vagina that can result in discharge, itching and pain. The cause is usually a change in the normal balance of vaginal bacteria or an infection. Vaginosis can also result from reduced estrogen levels after menopause.  The most common causes of vaginosis are:   Bacterial vaginosis which results from an overgrowth of one on several organisms that are normally present in your vagina.   Yeast infections which are caused by a naturally occurring fungus called candida.   Vaginal atrophy (atrophic vaginosis) which results from the thinning of the vagina from reduced estrogen levels after menopause.   Trichomoniasis which is caused by a parasite and is commonly transmitted by sexual intercourse.  Factors that increase your risk of developing vaginosis include: Medications, such as antibiotics and steroids Uncontrolled diabetes Use of hygiene products such as bubble bath, vaginal spray or vaginal deodorant Douching Wearing damp or tight-fitting clothing Using an intrauterine device (IUD) for birth control Hormonal changes, such as those associated with pregnancy, birth control pills or menopause Sexual activity Having a sexually transmitted infection  Your treatment plan is A single Diflucan (fluconazole) 150mg  tablet once.  I have electronically sent this prescription into the pharmacy that you have chosen. I have also sent in clotrimazole for you, however the betamethasone version you requested is not indicated as a vaginal suppository and should only be used externally for fungal skin conditions.  Be sure to take all of the medication as directed. Stop taking any medication if you develop a rash, tongue swelling or shortness of breath. Mothers who are breast feeding should  consider pumping and discarding their breast milk while on these antibiotics. However, there is no consensus that infant exposure at these doses would be harmful.  Remember that medication creams can weaken latex condoms. Marland Kitchen   HOME CARE:  Good hygiene may prevent some types of vaginosis from recurring and may relieve some symptoms:  Avoid baths, hot tubs and whirlpool spas. Rinse soap from your outer genital area after a shower, and dry the area well to prevent irritation. Don't use scented or harsh soaps, such as those with deodorant or antibacterial action. Avoid irritants. These include scented tampons and pads. Wipe from front to back after using the toilet. Doing so avoids spreading fecal bacteria to your vagina.  Other things that may help prevent vaginosis include:  Don't douche. Your vagina doesn't require cleansing other than normal bathing. Repetitive douching disrupts the normal organisms that reside in the vagina and can actually increase your risk of vaginal infection. Douching won't clear up a vaginal infection. Use a latex condom. Both female and female latex condoms may help you avoid infections spread by sexual contact. Wear cotton underwear. Also wear pantyhose with a cotton crotch. If you feel comfortable without it, skip wearing underwear to bed. Yeast thrives in [employer] Your symptoms should improve in the next day or two.  GET HELP RIGHT AWAY IF:  You have pain in your lower abdomen ( pelvic area or over your ovaries) You develop nausea or vomiting You develop a fever Your discharge changes or worsens You have persistent pain with intercourse You develop shortness of breath, a rapid pulse, or you faint.  These symptoms could be signs of problems or infections that  need to be evaluated by a medical provider now.  MAKE SURE YOU   Understand these instructions. Will watch your condition. Will get help right away if you are not doing well or get  worse.  Thank you for choosing an e-visit.  Your e-visit answers were reviewed by a board certified advanced clinical practitioner to complete your personal care plan. Depending upon the condition, your plan could have included both over the counter or prescription medications.  Please review your pharmacy choice. Make sure the pharmacy is open so you can pick up prescription now. If there is a problem, you may contact your provider through [employer] and have the prescription routed to another pharmacy.  Your safety is important to [place]. If you have drug allergies check your prescription carefully.   For the next 24 hours you can use MyChart to ask questions about today's visit, request a non-urgent call back, or ask for a work or school excuse. You will get an email in the next two days asking about your experience. I hope that your e-visit has been valuable and will speed your recovery.   I have spent 5 minutes in review of e-visit questionnaire, review and updating patient chart, medical decision making and response to patient.   [place], PA

## 2023-01-23 MED ORDER — CLOTRIMAZOLE-BETAMETHASONE 1-0.05 % EX CREA: 1.0000 | TOPICAL_CREAM | Freq: Every day | CUTANEOUS | 0 refills | Status: AC

## 2023-01-23 MED ORDER — FLUCONAZOLE 150 MG PO TABS: ORAL_TABLET | ORAL | 0 refills | Status: AC

## 2023-01-23 NOTE — Addendum Note (Signed)
Addended by: Viviano Simas E on: 01/23/2023 10:47 AM   Modules accepted: Orders

## 2023-02-13 ENCOUNTER — Other Ambulatory Visit: Payer: Self-pay | Admitting: *Deleted

## 2023-02-13 DIAGNOSIS — I10 Essential (primary) hypertension: Secondary | ICD-10-CM

## 2023-02-13 MED ORDER — HYDROCHLOROTHIAZIDE 50 MG PO TABS: 50.0000 mg | ORAL_TABLET | Freq: Every day | ORAL | 2 refills | Status: AC

## 2023-04-01 ENCOUNTER — Telehealth: Payer: Self-pay | Admitting: Family

## 2023-04-01 NOTE — Telephone Encounter (Signed)
Called pt to let her know we have tried to mail her paperwork to her it was sent back to the office and said address was invalid called pt number on chart and it says its no longer in service.

## 2023-06-25 NOTE — Telephone Encounter (Signed)
See routing comment(s) for message information.

## 2023-07-05 ENCOUNTER — Other Ambulatory Visit: Payer: Self-pay | Admitting: Family

## 2023-07-05 DIAGNOSIS — I10 Essential (primary) hypertension: Secondary | ICD-10-CM
